# Patient Record
Sex: Male | Born: 1965 | Race: White | Hispanic: No | Marital: Married | Smoking: Never smoker
Health system: Southern US, Community
[De-identification: ages and names within clinical notes are randomized; demographics above are authoritative.]

## PROBLEM LIST (undated history)

## (undated) DIAGNOSIS — N189 Chronic kidney disease, unspecified: Secondary | ICD-10-CM

## (undated) HISTORY — PX: EYE SURGERY: SHX253

---

## 1998-11-15 ENCOUNTER — Encounter: Payer: Self-pay | Admitting: Emergency Medicine

## 1998-11-15 ENCOUNTER — Emergency Department (HOSPITAL_COMMUNITY): Admission: EM | Admit: 1998-11-15 | Discharge: 1998-11-15 | Payer: Self-pay | Admitting: Emergency Medicine

## 2003-02-25 ENCOUNTER — Encounter: Payer: Self-pay | Admitting: Emergency Medicine

## 2003-02-25 ENCOUNTER — Emergency Department (HOSPITAL_COMMUNITY): Admission: AD | Admit: 2003-02-25 | Discharge: 2003-02-25 | Payer: Self-pay | Admitting: Emergency Medicine

## 2003-03-17 ENCOUNTER — Ambulatory Visit (HOSPITAL_COMMUNITY): Admission: RE | Admit: 2003-03-17 | Discharge: 2003-03-17 | Payer: Self-pay | Admitting: Urology

## 2003-03-17 ENCOUNTER — Encounter: Payer: Self-pay | Admitting: Urology

## 2004-02-23 ENCOUNTER — Emergency Department (HOSPITAL_COMMUNITY): Admission: EM | Admit: 2004-02-23 | Discharge: 2004-02-23 | Payer: Self-pay | Admitting: Emergency Medicine

## 2007-05-26 ENCOUNTER — Emergency Department (HOSPITAL_COMMUNITY): Admission: EM | Admit: 2007-05-26 | Discharge: 2007-05-26 | Payer: Self-pay | Admitting: Emergency Medicine

## 2008-02-13 ENCOUNTER — Emergency Department (HOSPITAL_COMMUNITY): Admission: EM | Admit: 2008-02-13 | Discharge: 2008-02-13 | Payer: Self-pay | Admitting: Family Medicine

## 2014-07-28 ENCOUNTER — Emergency Department (HOSPITAL_COMMUNITY): Payer: 59

## 2014-07-28 ENCOUNTER — Emergency Department (HOSPITAL_COMMUNITY)
Admission: EM | Admit: 2014-07-28 | Discharge: 2014-07-28 | Disposition: A | Discharge status: Home / Self Care | Payer: 59 | Attending: Emergency Medicine | Admitting: Emergency Medicine

## 2014-07-28 ENCOUNTER — Encounter (HOSPITAL_COMMUNITY): Payer: Self-pay | Admitting: Emergency Medicine

## 2014-07-28 DIAGNOSIS — R109 Unspecified abdominal pain: Secondary | ICD-10-CM | POA: Diagnosis present

## 2014-07-28 DIAGNOSIS — Z79899 Other long term (current) drug therapy: Secondary | ICD-10-CM | POA: Insufficient documentation

## 2014-07-28 DIAGNOSIS — N201 Calculus of ureter: Secondary | ICD-10-CM | POA: Diagnosis not present

## 2014-07-28 LAB — I-STAT CHEM 8, ED
BUN: 17 mg/dL (ref 6–23)
CHLORIDE: 102 mmol/L (ref 96–112)
Calcium, Ion: 1.21 mmol/L (ref 1.12–1.23)
Creatinine, Ser: 1.2 mg/dL (ref 0.50–1.35)
GLUCOSE: 111 mg/dL — AB (ref 70–99)
HCT: 45 % (ref 39.0–52.0)
HEMOGLOBIN: 15.3 g/dL (ref 13.0–17.0)
POTASSIUM: 4.2 mmol/L (ref 3.5–5.1)
Sodium: 141 mmol/L (ref 135–145)
TCO2: 23 mmol/L (ref 0–100)

## 2014-07-28 LAB — URINE MICROSCOPIC-ADD ON

## 2014-07-28 LAB — URINALYSIS, ROUTINE W REFLEX MICROSCOPIC
Bilirubin Urine: NEGATIVE
Glucose, UA: NEGATIVE mg/dL
Ketones, ur: NEGATIVE mg/dL
Leukocytes, UA: NEGATIVE
Nitrite: NEGATIVE
Protein, ur: 30 mg/dL — AB
Specific Gravity, Urine: 1.024 (ref 1.005–1.030)
Urobilinogen, UA: 1 mg/dL (ref 0.0–1.0)
pH: 7 (ref 5.0–8.0)

## 2014-07-28 MED ORDER — ONDANSETRON HCL 4 MG PO TABS: 4.0000 mg | ORAL_TABLET | Freq: Four times a day (QID) | ORAL | Status: DC

## 2014-07-28 MED ORDER — OXYCODONE-ACETAMINOPHEN 5-325 MG PO TABS: 1.0000 | ORAL_TABLET | Freq: Four times a day (QID) | ORAL | Status: DC | PRN

## 2014-07-28 MED ORDER — IBUPROFEN 600 MG PO TABS: 600.0000 mg | ORAL_TABLET | Freq: Four times a day (QID) | ORAL | Status: DC | PRN

## 2014-07-28 MED ORDER — HYDROMORPHONE HCL 1 MG/ML IJ SOLN
1.0000 mg | Freq: Once | INTRAMUSCULAR | Status: AC
  Administered 2014-07-28: 1 mg via INTRAVENOUS
  Filled 2014-07-28: qty 1

## 2014-07-28 NOTE — ED Notes (Signed)
Patient stated his stomach is upset. RN Rennis Chris. Notified

## 2014-07-28 NOTE — ED Notes (Signed)
Patient transported to CT 

## 2014-07-28 NOTE — ED Provider Notes (Signed)
CSN: [id number]     Arrival date & time 07/28/14  1250 History   First MD Initiated Contact with Patient 07/28/14 1422     Chief Complaint  Patient presents with  . Flank Pain     (Consider location/radiation/quality/duration/timing/severity/associated sxs/prior Treatment) HPI Comments: Pt is a 49 y.o. male with 1 day history of left flank pain that wrapped around to abdomen. PMH significant for renal stone requiring lithotripsy 10 years ago. Pt says he went to the gym earlier today and after finishing his workout had sudden onset left lower back pain that wrapped around to the front of his abdomen while driving. Describes pain as crampy, comes/goes briefly. He was unable to find any position that made the pain better. Pain was made worse with bending over in the ED when changing into gown, after which he got some dilaudid which has completely taken the pain away. Last void was earlier this morning, says he is unable to give sample currently. Last BM was this morning, normal, no blood or dark stool. He feels like the pain was similar to the kidney stone he had in the past.  Patient is a 49 y.o. male presenting with flank pain. The history is provided by the patient. No language interpreter was used.  Flank Pain This is a new problem. The current episode started today. The problem occurs constantly. The problem has been resolved (currently gone after receiving dilaudid). Associated symptoms include abdominal pain, a fever (felt hot flash, no measured fever) and nausea. Pertinent negatives include no anorexia, change in bowel habit, chest pain, coughing, headaches, myalgias, rash or vomiting. The symptoms are aggravated by bending and twisting. Treatments tried: IV dilaudid in ED. The treatment provided significant relief.    History reviewed. No pertinent past medical history. History reviewed. No pertinent past surgical history. No family history on file. History  Substance Use Topics  . Smoking  status: Never Smoker   . Smokeless tobacco: Not on file  . Alcohol Use: Yes    Review of Systems  Constitutional: Positive for fever (felt hot flash, no measured fever). Negative for appetite change.  HENT: Negative.   Eyes: Negative.   Respiratory: Negative for cough and shortness of breath.   Cardiovascular: Negative for chest pain and leg swelling.  Gastrointestinal: Positive for nausea and abdominal pain. Negative for vomiting, diarrhea, constipation, blood in stool, anorexia and change in bowel habit.  Genitourinary: Positive for flank pain. Negative for dysuria and urgency.  Musculoskeletal: Positive for back pain. Negative for myalgias.  Skin: Negative for rash.  Neurological: Negative for dizziness and headaches.  All other systems reviewed and are negative.     Allergies  Review of patient's allergies indicates no known allergies.  Home Medications   Prior to Admission medications   Medication Sig Start Date End Date Taking? Authorizing Provider  KRILL OIL PO Take by mouth.   Yes Historical Provider, MD  Multiple Vitamin (MULTIVITAMIN WITH MINERALS) TABS tablet Take 1 tablet by mouth daily.   Yes Historical Provider, MD   BP 124/74 mmHg  Pulse 56  Temp(Src) 97.6 F (36.4 C) (Oral)  Resp 18  SpO2 97% Physical Exam  Constitutional: He is oriented to person, place, and time. He appears well-developed and well-nourished.  HENT:  Head: Normocephalic and atraumatic.  Eyes: Conjunctivae and EOM are normal. Pupils are equal, round, and reactive to light.  Cardiovascular: Normal rate, regular rhythm, normal heart sounds and intact distal pulses.  Exam reveals no gallop and no  friction rub.   No murmur heard. Pulmonary/Chest: Effort normal and breath sounds normal. No respiratory distress. He has no wheezes.  Abdominal: Bowel sounds are normal. He exhibits no distension. There is no tenderness. There is no rebound and no guarding.  Somewhat firm abdomen, but completely  denies tenderness to deep palpation  Musculoskeletal: He exhibits no edema or tenderness.  Neurological: He is alert and oriented to person, place, and time.  Skin: Skin is warm and dry.  Psychiatric: He has a normal mood and affect. His behavior is normal. Judgment and thought content normal.  Nursing note and vitals reviewed.   ED Course  Procedures (including critical care time) Labs Review Labs Reviewed  URINALYSIS, ROUTINE W REFLEX MICROSCOPIC    Imaging Review No results found.   EKG Interpretation None      MDM   Final diagnoses:  None   UA pending. CT renal stone study ordered. Pt reports complete resolution of the pain with 1mg  dilaudid x 1.  Care transferred over at 3:56 PM   Leone Brand, MD 07/28/14 [place], MD 07/29/14 [phone]

## 2014-07-28 NOTE — ED Notes (Signed)
Bed: EL38 Expected date:  Expected time:  Means of arrival:  Comments: Pt still in room

## 2014-07-28 NOTE — Discharge Instructions (Signed)
Pain control:  Start taking ibuprofen 600mg  every 6 hours for pain.  For breakthrough pain, take 1-2 tablets of percocet every 6 hours for severe pain  Continue to drink plenty of water. If you do not pass the stone call your urologist.  Kidney Stones Kidney stones (urolithiasis) are deposits that form inside your kidneys. The intense pain is caused by the stone moving through the urinary tract. When the stone moves, the ureter goes into spasm around the stone. The stone is usually passed in the urine.  CAUSES   A disorder that makes certain neck glands produce too much parathyroid hormone (primary hyperparathyroidism).  A buildup of uric acid crystals, similar to gout in your joints.  Narrowing (stricture) of the ureter.  A kidney obstruction present at birth (congenital obstruction).  Previous surgery on the kidney or ureters.  Numerous kidney infections. SYMPTOMS   Feeling sick to your stomach (nauseous).  Throwing up (vomiting).  Blood in the urine (hematuria).  Pain that usually spreads (radiates) to the groin.  Frequency or urgency of urination. DIAGNOSIS   Taking a history and physical exam.  Blood or urine tests.  CT scan.  Occasionally, an examination of the inside of the urinary bladder (cystoscopy) is performed. TREATMENT   Observation.  Increasing your fluid intake.  Extracorporeal shock wave lithotripsy--This is a noninvasive procedure that uses shock waves to break up kidney stones.  Surgery may be needed if you have severe pain or persistent obstruction. There are various surgical procedures. Most of the procedures are performed with the use of small instruments. Only small incisions are needed to accommodate these instruments, so recovery time is minimized. The size, location, and chemical composition are all important variables that will determine the proper choice of action for you. Talk to your health care provider to better understand your  situation so that you will minimize the risk of injury to yourself and your kidney.  HOME CARE INSTRUCTIONS   Drink enough water and fluids to keep your urine clear or pale yellow. This will help you to pass the stone or stone fragments.  Strain all urine through the provided strainer. Keep all particulate matter and stones for your health care provider to see. The stone causing the pain may be as small as a grain of salt. It is very important to use the strainer each and every time you pass your urine. The collection of your stone will allow your health care provider to analyze it and verify that a stone has actually passed. The stone analysis will often identify what you can do to reduce the incidence of recurrences.  Only take over-the-counter or prescription medicines for pain, discomfort, or fever as directed by your health care provider.  Make a follow-up appointment with your health care provider as directed.  Get follow-up X-rays if required. The absence of pain does not always mean that the stone has passed. It may have only stopped moving. If the urine remains completely obstructed, it can cause loss of kidney function or even complete destruction of the kidney. It is your responsibility to make sure X-rays and follow-ups are completed. Ultrasounds of the kidney can show blockages and the status of the kidney. Ultrasounds are not associated with any radiation and can be performed easily in a matter of minutes. SEEK MEDICAL CARE IF:  You experience pain that is progressive and unresponsive to any pain medicine you have been prescribed. SEEK IMMEDIATE MEDICAL CARE IF:   Pain cannot be controlled with  the prescribed medicine.  You have a fever or shaking chills.  The severity or intensity of pain increases over 18 hours and is not relieved by pain medicine.  You develop a new onset of abdominal pain.  You feel faint or pass out.  You are unable to urinate. MAKE SURE YOU:    Understand these instructions.  Will watch your condition.  Will get help right away if you are not doing well or get worse. Document Released: 06/06/2005 Document Revised: 02/06/2013 Document Reviewed: 11/07/2012 [facility] Patient Information 2015 [place], [place]. This information is not intended to replace advice given to you by your health care provider. Make sure you discuss any questions you have with your health care provider.

## 2014-07-28 NOTE — ED Provider Notes (Signed)
Care assumed from Dr. Audie Pinto.  CT Stone study shows 12x81mm proximal ureteral stone.  Pain is 2/10 currently.  Will discuss with Urology.  Pain still controlled.  I spoke with Dr. Junious Silk who will help establish close outpatient follow up.  Pt was given return precautions including worsening pain, vomiting, fevers, etc.    Clinical Impression: 1. Ureteral stone   2. Left flank pain       Houston Siren III, MD 07/28/14 [phone]

## 2014-07-28 NOTE — ED Notes (Signed)
Patient states he is unable to give UA sample.

## 2014-07-28 NOTE — ED Notes (Signed)
Per EMS, Pt from home, c/o L flank pain to L groin pain starting around 10 am this morning. Pt has hx of kidney stones. A&Ox4. Pt had worked out prior to feeling the pain. Pain has gotten progressively worse since. Pt compares the pain to previous kidney stone pain. Pt pale and diaphoretic, restless. Pt received 250 mcg of fentanyl. Pain 18/20.

## 2014-07-28 NOTE — ED Notes (Signed)
Bedside report received from previous RN, Lanelle Bal.

## 2014-07-28 NOTE — ED Notes (Signed)
Pt reports while attempting changing into gown onset of nausea and sharp/throbbing pain to left flank pain.

## 2014-07-28 NOTE — ED Notes (Signed)
Bed: [facility]ALD Expected date:  Expected time:  Means of arrival:  Comments: ems 

## 2014-07-28 NOTE — ED Notes (Signed)
Ginger ale given

## 2014-07-28 NOTE — Progress Notes (Signed)
  CARE MANAGEMENT ED NOTE 07/28/2014  Patient:  JAYON, MATTON A   Account Number:  [id number]  Date Initiated:  07/28/2014  Documentation initiated by:  Livia Snellen  Subjective/Objective Assessment:   Patient presents to Ed with flank pain     Subjective/Objective Assessment Detail:   Pmhx of kidney stone     Action/Plan:   Action/Plan Detail:   Anticipated DC Date:       Status Recommendation to Physician:   Result of Recommendation:    Other ED [place]  Other  PCP issues    Choice offered to / List presented to:            Status of service:  Completed, signed off  ED Comments:   ED Comments Detail:  EDCM spoek to patient and his wife at bedside.  Patient reports he oes nto have a pcp because, "I never get sick." Patient reports he has been seen by Dr. Christella Noa of [facility] in [place] who is his wife's pcp as well. EDCM Discussed importance and purpose of having a pcp. Patient verbalized understanding and plans on making Dr. Doyle Askew his pcp.  N further EDCM needs at this time.

## 2014-07-31 ENCOUNTER — Other Ambulatory Visit: Payer: Self-pay | Admitting: Urology

## 2014-08-04 ENCOUNTER — Encounter (HOSPITAL_COMMUNITY): Payer: Self-pay | Admitting: General Practice

## 2014-08-11 ENCOUNTER — Ambulatory Visit (HOSPITAL_COMMUNITY): Payer: 59

## 2014-08-11 ENCOUNTER — Encounter (HOSPITAL_COMMUNITY): Payer: Self-pay | Admitting: General Practice

## 2014-08-11 ENCOUNTER — Ambulatory Visit (HOSPITAL_COMMUNITY)
Admission: RE | Admit: 2014-08-11 | Discharge: 2014-08-11 | Disposition: A | Discharge status: Home / Self Care | Payer: 59 | Source: Ambulatory Visit | Attending: Urology | Admitting: Urology

## 2014-08-11 ENCOUNTER — Encounter (HOSPITAL_COMMUNITY)
Admission: RE | Disposition: A | Discharge status: Home / Self Care | Payer: Self-pay | Source: Ambulatory Visit | Attending: Urology

## 2014-08-11 DIAGNOSIS — N201 Calculus of ureter: Secondary | ICD-10-CM | POA: Insufficient documentation

## 2014-08-11 HISTORY — DX: Chronic kidney disease, unspecified: N18.9

## 2014-08-11 SURGERY — LITHOTRIPSY, ESWL
Anesthesia: LOCAL | Laterality: Left

## 2014-08-11 MED ORDER — DIPHENHYDRAMINE HCL 25 MG PO CAPS
25.0000 mg | ORAL_CAPSULE | ORAL | Status: AC
  Administered 2014-08-11: 25 mg via ORAL
  Filled 2014-08-11: qty 1

## 2014-08-11 MED ORDER — CIPROFLOXACIN HCL 500 MG PO TABS
500.0000 mg | ORAL_TABLET | ORAL | Status: AC
  Administered 2014-08-11: 500 mg via ORAL
  Filled 2014-08-11: qty 1

## 2014-08-11 MED ORDER — DEXTROSE-NACL 5-0.45 % IV SOLN
INTRAVENOUS | Status: DC
  Administered 2014-08-11: 16:00:00 via INTRAVENOUS

## 2014-08-11 MED ORDER — DIAZEPAM 5 MG PO TABS
10.0000 mg | ORAL_TABLET | ORAL | Status: AC
  Administered 2014-08-11: 10 mg via ORAL
  Filled 2014-08-11: qty 2

## 2014-08-11 NOTE — H&P (Signed)
Active Problems Problems  1. Calculus of left ureter (N20.1)  History of Present Illness Mike Pena is a 49 yo WM former patient with a history of stones who was in the ER Monday for severe left flank pain with nausea and vomiting. He was found on CT to have a 12 x 55m left proximal stone. He had no hematuria. He had no voiding complaints. He had ESWL for a right sided stone about 12 years ago.   Surgical History Problems  1. History of Lithotripsy  Current Meds 1. Fish Oil CAPS;  Therapy: (Recorded:78Feb2016) to Recorded 2. Oxycodone-Acetaminophen 5-325 MG Oral Tablet;  Therapy: (Recorded:78Feb2016) to Recorded  Allergies Medication  1. No Known Drug Allergies  Family History Problems  1. No pertinent family history : Mother  Social History Problems    Married   Never a smoker   No alcohol use   No caffeine use   Number of children   1 girl   Occupation   PEngineer, structural Review of Systems Genitourinary, constitutional, skin, eye, otolaryngeal, hematologic/lymphatic, cardiovascular, pulmonary, endocrine, musculoskeletal, gastrointestinal, neurological and psychiatric system(s) were reviewed and pertinent findings if present are noted and are otherwise negative.  Gastrointestinal: nausea and flank pain.    Vitals Vital Signs [Data Includes: Last 1 Day]  Recorded: 123VKP224408:25AM  Height: 5 ft 11 in Weight: 205 lb  BMI Calculated: 28.59 BSA Calculated: 2.13 Blood Pressure: 120 / 80 Temperature: 97.1 F Heart Rate: 63  Physical Exam Constitutional: Well nourished and well developed . No acute distress.  ENT:. The ears and nose are normal in appearance.  Neck: The appearance of the neck is normal and no neck mass is present.  Pulmonary: No respiratory distress and normal respiratory rhythm and effort.  Cardiovascular: Heart rate and rhythm are normal . No peripheral edema.  Abdomen: The abdomen is soft and nontender. No masses are palpated. No CVA  tenderness. No hernias are palpable. No hepatosplenomegaly noted.  Skin: Normal skin turgor, no visible rash and no visible skin lesions.  Neuro/Psych:. Mood and affect are appropriate.    Results/Data Urine [Data Includes: Last 1 Day]   [id number] COLOR YELLOW   APPEARANCE CLEAR   SPECIFIC GRAVITY 1.030   pH 5.5   GLUCOSE NEG mg/dL  BILIRUBIN NEG   KETONE NEG mg/dL  BLOOD SMALL   PROTEIN NEG mg/dL  UROBILINOGEN 0.2 mg/dL  NITRITE NEG   LEUKOCYTE ESTERASE NEG   SQUAMOUS EPITHELIAL/HPF RARE   WBC 0-2 WBC/hpf  RBC 3-6 RBC/hpf  BACTERIA RARE   CRYSTALS NONE SEEN   CASTS NONE SEEN   Other MUCUS NOTED    Old records or history reviewed: ER notes reviewed.  The following images/tracing/specimen were independently visualized:  CT films and report reviewed. KUB today show a 12 x 881mstone just above the left L3 transverse process. There are no other bone, gas or soft tissue abnormalities.  The following clinical lab reports were reviewed:  UA and ER labs reviewed.    Assessment Assessed  1. Calculus of left ureter (N20.1)  He has a 12 x 87m5mtone in the left proximal ureter that has not progressed since Monday.   Plan Calculus of left ureter  1. Start: Tamsulosin HCl - 0.4 MG Oral Capsule; TAKE 1 CAPSULE Daily 2. Follow-up Schedule Surgery Office  Follow-up  Status: Hold For - Appointment   Requested for: [id number] KUB; Status:Resulted - Requires Verification;   Done: 1: 70LID0301:00AM Health Maintenance  4. UA With REFLEX; [  Do Not Release]; Status:Resulted - Requires Verification;   Done:  55OPR6742 08:19AM  I reviewed the options of MET, ESWL and ureteroscopy and I think ESWL is the most appropriate active treatment at this time.  I reviewed the risks of bleeding, infection, renal and adjacent organ injury, thrombotic events and anesthetic risks.   I am going to add tamsulosin.  He may want to wait until 2/22 for the procedure if his pain remains controlled.

## 2014-08-11 NOTE — Discharge Instructions (Signed)

## 2015-03-23 ENCOUNTER — Other Ambulatory Visit: Payer: Self-pay | Admitting: Urology

## 2015-04-07 ENCOUNTER — Encounter (HOSPITAL_COMMUNITY): Payer: Self-pay

## 2015-04-15 NOTE — H&P (Signed)
  Active Problems Problems  1. Kidney stone on left side (N20.0)  History of Present Illness Mike Pena returns today in f/u for his history of stones. He had ESWL on 08/11/14 for a left UPJ stone. He was found to have 2 4-73mm LLP fragments on f/u KUB in 4/16 and those remain unchanged on KUB today. He has 10-20 RBC's on UA. He has had no flank pain, hematuria or voiding complaints.   Past Medical History Problems  1. History of renal calculi (H06.237)  Surgical History Problems  1. History of Lithotripsy 2. History of Lithotripsy  Current Meds 1. Fish Oil CAPS;  Therapy: (Recorded:10Feb2016) to Recorded  Allergies Medication  1. No Known Drug Allergies  Family History Problems  1. No pertinent family history : Mother  Social History Problems  1. Married 2. Never a smoker 3. No alcohol use 4. No caffeine use 5. Number of children   1 girl 6. Occupation   Engineer, structural  Review of Systems Genitourinary, constitutional, skin, eye, otolaryngeal, hematologic/lymphatic, cardiovascular, pulmonary, endocrine, musculoskeletal, gastrointestinal, neurological and psychiatric system(s) were reviewed and pertinent findings if present are noted and are otherwise negative.    Vitals Vital Signs [Data Includes: Last 1 Day]  Recorded: 30Sep2016 01:18PM  Blood Pressure: 124 / 80 Temperature: 97.3 F Heart Rate: 67  Physical Exam Constitutional: Well nourished and well developed . No acute distress.  Pulmonary: No respiratory distress and normal respiratory rhythm and effort.  Cardiovascular: Heart rate and rhythm are normal . No peripheral edema.    Results/Data Urine [Data Includes: Last 1 Day]   30Sep2016  COLOR YELLOW   APPEARANCE CLEAR   SPECIFIC GRAVITY 1.025   pH 5.5   GLUCOSE NEGATIVE   BILIRUBIN NEGATIVE   KETONE NEGATIVE   BLOOD 3+   PROTEIN NEGATIVE   NITRITE NEGATIVE   LEUKOCYTE ESTERASE NEGATIVE   SQUAMOUS EPITHELIAL/HPF 0-5 HPF  WBC 0-5 WBC/HPF  RBC 10-20  RBC/HPF  BACTERIA NONE SEEN HPF  CRYSTALS NONE SEEN HPF  CASTS NONE SEEN LPF  Yeast NONE SEEN HPF   The following images/tracing/specimen were independently visualized:  KUB today shows 2 4-109mm LLP fragments that have not changed since April. There are no other bone, gas or soft tissue abnormalities.  The following clinical lab reports were reviewed:  UA reviewed.    Assessment Assessed  1. Kidney stone on left side (N20.0)  [name]'s stones have not changed and he needs to be considered for repeat treatment.   Plan Health Maintenance  1. UA With REFLEX; [Do Not Release]; Status:Resulted - Requires Verification;   Done:  [id number] 01:02PM Kidney stone on left side  2. Follow-up Schedule Surgery Office  Follow-up  Status: Complete  Done: [id number]  I discussed MET, ESWL and ureteroscopy and we will get him set up for ESWL.  I reviewed the risks again with him in detail and we will schedule him electively.   Signatures Electronically signed by : Irine Seal, M.D.; Mar 20 2015  2:06PM EST

## 2015-04-16 ENCOUNTER — Ambulatory Visit (HOSPITAL_COMMUNITY): Payer: 59

## 2015-04-16 ENCOUNTER — Encounter (HOSPITAL_COMMUNITY): Payer: Self-pay | Admitting: *Deleted

## 2015-04-16 ENCOUNTER — Encounter (HOSPITAL_COMMUNITY)
Admission: RE | Disposition: A | Discharge status: Home / Self Care | Payer: Self-pay | Source: Ambulatory Visit | Attending: Urology

## 2015-04-16 ENCOUNTER — Ambulatory Visit (HOSPITAL_COMMUNITY)
Admission: RE | Admit: 2015-04-16 | Discharge: 2015-04-16 | Disposition: A | Discharge status: Home / Self Care | Payer: 59 | Source: Ambulatory Visit | Attending: Urology | Admitting: Urology

## 2015-04-16 DIAGNOSIS — N200 Calculus of kidney: Secondary | ICD-10-CM | POA: Diagnosis present

## 2015-04-16 DIAGNOSIS — Z87442 Personal history of urinary calculi: Secondary | ICD-10-CM | POA: Insufficient documentation

## 2015-04-16 SURGERY — LITHOTRIPSY, ESWL
Anesthesia: LOCAL | Laterality: Left

## 2015-04-16 MED ORDER — DIAZEPAM 5 MG PO TABS
10.0000 mg | ORAL_TABLET | ORAL | Status: AC
  Administered 2015-04-16: 10 mg via ORAL
  Filled 2015-04-16: qty 2

## 2015-04-16 MED ORDER — DIPHENHYDRAMINE HCL 25 MG PO CAPS
25.0000 mg | ORAL_CAPSULE | ORAL | Status: AC
  Administered 2015-04-16: 25 mg via ORAL
  Filled 2015-04-16: qty 1

## 2015-04-16 MED ORDER — CIPROFLOXACIN HCL 500 MG PO TABS
500.0000 mg | ORAL_TABLET | ORAL | Status: AC
  Administered 2015-04-16: 500 mg via ORAL
  Filled 2015-04-16: qty 1

## 2015-04-16 MED ORDER — SODIUM CHLORIDE 0.9 % IV SOLN
INTRAVENOUS | Status: DC
  Administered 2015-04-16: 10:00:00 via INTRAVENOUS

## 2015-04-16 NOTE — Interval H&P Note (Signed)
History and Physical Interval Note:  04/16/2015 11:46 AM  Mike Pena  has presented today for surgery, with the diagnosis of LEFT URETERAL STONES  The various methods of treatment have been discussed with the patient and family. After consideration of risks, benefits and other options for treatment, the patient has consented to  Procedure(s): LEFT EXTRACORPOREAL SHOCK WAVE LITHOTRIPSY (ESWL) (Left) as a surgical intervention .  The patient's history has been reviewed, patient examined, no change in status, stable for surgery.  I have reviewed the patient's chart and labs.  Questions were answered to the patient's satisfaction.     [name],LES

## 2015-04-16 NOTE — Discharge Instructions (Signed)
1. You should strain your urine and collect all fragments and bring them to your follow up appointment.  °2. You should take your pain medication as needed.  Please call if your pain is severe to the point that it is not controlled with your pain medication. °3. You should call if you develop fever > 101 or persistent nausea or vomiting. °4. Your doctor may prescribe tamsulosin to take to help facilitate stone passage. °

## 2015-04-16 NOTE — Op Note (Signed)
Please see scanned chart for ESWL operative note. 

## 2016-10-10 DIAGNOSIS — N200 Calculus of kidney: Secondary | ICD-10-CM | POA: Diagnosis not present

## 2017-06-14 DIAGNOSIS — Z131 Encounter for screening for diabetes mellitus: Secondary | ICD-10-CM | POA: Diagnosis not present

## 2017-06-14 DIAGNOSIS — Z0000 Encounter for general adult medical examination without abnormal findings: Secondary | ICD-10-CM | POA: Diagnosis not present

## 2017-06-14 DIAGNOSIS — Z136 Encounter for screening for cardiovascular disorders: Secondary | ICD-10-CM | POA: Diagnosis not present

## 2017-07-31 DIAGNOSIS — M5386 Other specified dorsopathies, lumbar region: Secondary | ICD-10-CM | POA: Diagnosis not present

## 2017-07-31 DIAGNOSIS — M9902 Segmental and somatic dysfunction of thoracic region: Secondary | ICD-10-CM | POA: Diagnosis not present

## 2017-07-31 DIAGNOSIS — M9908 Segmental and somatic dysfunction of rib cage: Secondary | ICD-10-CM | POA: Diagnosis not present

## 2017-08-01 DIAGNOSIS — M5386 Other specified dorsopathies, lumbar region: Secondary | ICD-10-CM | POA: Diagnosis not present

## 2017-08-01 DIAGNOSIS — M9908 Segmental and somatic dysfunction of rib cage: Secondary | ICD-10-CM | POA: Diagnosis not present

## 2017-08-01 DIAGNOSIS — M9902 Segmental and somatic dysfunction of thoracic region: Secondary | ICD-10-CM | POA: Diagnosis not present

## 2017-08-03 DIAGNOSIS — M9902 Segmental and somatic dysfunction of thoracic region: Secondary | ICD-10-CM | POA: Diagnosis not present

## 2017-08-03 DIAGNOSIS — M9908 Segmental and somatic dysfunction of rib cage: Secondary | ICD-10-CM | POA: Diagnosis not present

## 2017-08-03 DIAGNOSIS — M5386 Other specified dorsopathies, lumbar region: Secondary | ICD-10-CM | POA: Diagnosis not present

## 2017-08-08 DIAGNOSIS — M9902 Segmental and somatic dysfunction of thoracic region: Secondary | ICD-10-CM | POA: Diagnosis not present

## 2017-08-08 DIAGNOSIS — M5386 Other specified dorsopathies, lumbar region: Secondary | ICD-10-CM | POA: Diagnosis not present

## 2017-08-08 DIAGNOSIS — M9908 Segmental and somatic dysfunction of rib cage: Secondary | ICD-10-CM | POA: Diagnosis not present

## 2017-08-10 DIAGNOSIS — M9902 Segmental and somatic dysfunction of thoracic region: Secondary | ICD-10-CM | POA: Diagnosis not present

## 2017-08-10 DIAGNOSIS — M9908 Segmental and somatic dysfunction of rib cage: Secondary | ICD-10-CM | POA: Diagnosis not present

## 2017-08-10 DIAGNOSIS — M5386 Other specified dorsopathies, lumbar region: Secondary | ICD-10-CM | POA: Diagnosis not present

## 2017-08-16 DIAGNOSIS — M9908 Segmental and somatic dysfunction of rib cage: Secondary | ICD-10-CM | POA: Diagnosis not present

## 2017-08-16 DIAGNOSIS — M9902 Segmental and somatic dysfunction of thoracic region: Secondary | ICD-10-CM | POA: Diagnosis not present

## 2017-08-16 DIAGNOSIS — M5386 Other specified dorsopathies, lumbar region: Secondary | ICD-10-CM | POA: Diagnosis not present

## 2017-08-17 DIAGNOSIS — M9908 Segmental and somatic dysfunction of rib cage: Secondary | ICD-10-CM | POA: Diagnosis not present

## 2017-08-17 DIAGNOSIS — M5386 Other specified dorsopathies, lumbar region: Secondary | ICD-10-CM | POA: Diagnosis not present

## 2017-08-17 DIAGNOSIS — M9902 Segmental and somatic dysfunction of thoracic region: Secondary | ICD-10-CM | POA: Diagnosis not present

## 2018-01-23 DIAGNOSIS — Z1211 Encounter for screening for malignant neoplasm of colon: Secondary | ICD-10-CM | POA: Diagnosis not present

## 2018-02-07 DIAGNOSIS — D485 Neoplasm of uncertain behavior of skin: Secondary | ICD-10-CM | POA: Diagnosis not present

## 2018-02-07 DIAGNOSIS — D2272 Melanocytic nevi of left lower limb, including hip: Secondary | ICD-10-CM | POA: Diagnosis not present

## 2018-02-07 DIAGNOSIS — D3610 Benign neoplasm of peripheral nerves and autonomic nervous system, unspecified: Secondary | ICD-10-CM | POA: Diagnosis not present

## 2018-02-07 DIAGNOSIS — L814 Other melanin hyperpigmentation: Secondary | ICD-10-CM | POA: Diagnosis not present

## 2018-02-26 ENCOUNTER — Emergency Department (HOSPITAL_COMMUNITY)
Admission: EM | Admit: 2018-02-26 | Discharge: 2018-02-26 | Disposition: A | Discharge status: Home / Self Care | Payer: 59 | Source: Home / Self Care | Attending: Emergency Medicine | Admitting: Emergency Medicine

## 2018-02-26 ENCOUNTER — Encounter (HOSPITAL_COMMUNITY): Payer: Self-pay | Admitting: Internal Medicine

## 2018-02-26 ENCOUNTER — Encounter (HOSPITAL_COMMUNITY): Payer: Self-pay | Admitting: Emergency Medicine

## 2018-02-26 ENCOUNTER — Emergency Department (HOSPITAL_COMMUNITY): Payer: 59

## 2018-02-26 ENCOUNTER — Other Ambulatory Visit: Payer: Self-pay

## 2018-02-26 ENCOUNTER — Observation Stay (HOSPITAL_COMMUNITY)
Admission: EM | Admit: 2018-02-26 | Discharge: 2018-02-28 | Disposition: A | Discharge status: Home / Self Care | Payer: 59 | Attending: Internal Medicine | Admitting: Internal Medicine

## 2018-02-26 DIAGNOSIS — N202 Calculus of kidney with calculus of ureter: Principal | ICD-10-CM | POA: Insufficient documentation

## 2018-02-26 DIAGNOSIS — Z79899 Other long term (current) drug therapy: Secondary | ICD-10-CM

## 2018-02-26 DIAGNOSIS — R112 Nausea with vomiting, unspecified: Secondary | ICD-10-CM | POA: Diagnosis not present

## 2018-02-26 DIAGNOSIS — N201 Calculus of ureter: Secondary | ICD-10-CM

## 2018-02-26 DIAGNOSIS — D72828 Other elevated white blood cell count: Secondary | ICD-10-CM | POA: Diagnosis not present

## 2018-02-26 DIAGNOSIS — N138 Other obstructive and reflux uropathy: Secondary | ICD-10-CM | POA: Diagnosis not present

## 2018-02-26 DIAGNOSIS — N400 Enlarged prostate without lower urinary tract symptoms: Secondary | ICD-10-CM | POA: Diagnosis not present

## 2018-02-26 DIAGNOSIS — N401 Enlarged prostate with lower urinary tract symptoms: Secondary | ICD-10-CM | POA: Diagnosis not present

## 2018-02-26 DIAGNOSIS — N211 Calculus in urethra: Secondary | ICD-10-CM | POA: Diagnosis not present

## 2018-02-26 DIAGNOSIS — N200 Calculus of kidney: Secondary | ICD-10-CM

## 2018-02-26 DIAGNOSIS — I959 Hypotension, unspecified: Secondary | ICD-10-CM | POA: Diagnosis not present

## 2018-02-26 DIAGNOSIS — N179 Acute kidney failure, unspecified: Secondary | ICD-10-CM | POA: Insufficient documentation

## 2018-02-26 DIAGNOSIS — R109 Unspecified abdominal pain: Secondary | ICD-10-CM | POA: Diagnosis not present

## 2018-02-26 DIAGNOSIS — R1084 Generalized abdominal pain: Secondary | ICD-10-CM | POA: Diagnosis not present

## 2018-02-26 DIAGNOSIS — R0902 Hypoxemia: Secondary | ICD-10-CM | POA: Diagnosis not present

## 2018-02-26 DIAGNOSIS — N132 Hydronephrosis with renal and ureteral calculous obstruction: Secondary | ICD-10-CM | POA: Diagnosis not present

## 2018-02-26 DIAGNOSIS — D72829 Elevated white blood cell count, unspecified: Secondary | ICD-10-CM | POA: Diagnosis not present

## 2018-02-26 LAB — COMPREHENSIVE METABOLIC PANEL
ALT: 27 U/L (ref 0–44)
AST: 30 U/L (ref 15–41)
Albumin: 4.2 g/dL (ref 3.5–5.0)
Alkaline Phosphatase: 50 U/L (ref 38–126)
Anion gap: 12 (ref 5–15)
BUN: 18 mg/dL (ref 6–20)
CO2: 24 mmol/L (ref 22–32)
Calcium: 9.1 mg/dL (ref 8.9–10.3)
Chloride: 106 mmol/L (ref 98–111)
Creatinine, Ser: 1.66 mg/dL — ABNORMAL HIGH (ref 0.61–1.24)
GFR calc Af Amer: 54 mL/min — ABNORMAL LOW (ref >60)
GFR calc non Af Amer: 46 mL/min — ABNORMAL LOW (ref >60)
Glucose, Bld: 146 mg/dL — ABNORMAL HIGH (ref 70–99)
Potassium: 4.7 mmol/L (ref 3.5–5.1)
Sodium: 142 mmol/L (ref 135–145)
Total Bilirubin: 1.1 mg/dL (ref 0.3–1.2)
Total Protein: 6.7 g/dL (ref 6.5–8.1)

## 2018-02-26 LAB — URINALYSIS, ROUTINE W REFLEX MICROSCOPIC
BILIRUBIN URINE: NEGATIVE
Bacteria, UA: NONE SEEN
Glucose, UA: NEGATIVE mg/dL
Ketones, ur: NEGATIVE mg/dL
NITRITE: NEGATIVE
Protein, ur: NEGATIVE mg/dL
RBC / HPF: >50 RBC/hpf — ABNORMAL HIGH (ref 0–5)
Specific Gravity, Urine: 1.018 (ref 1.005–1.030)
pH: 7 (ref 5.0–8.0)

## 2018-02-26 LAB — CBC WITH DIFFERENTIAL/PLATELET
Basophils Absolute: 0 10*3/uL (ref 0.0–0.1)
Basophils Relative: 0 %
Eosinophils Absolute: 0 10*3/uL (ref 0.0–0.7)
Eosinophils Relative: 0 %
HCT: 43.2 % (ref 39.0–52.0)
Hemoglobin: 15 g/dL (ref 13.0–17.0)
Lymphocytes Relative: 8 %
Lymphs Abs: 1 10*3/uL (ref 0.7–4.0)
MCH: 29.3 pg (ref 26.0–34.0)
MCHC: 34.7 g/dL (ref 30.0–36.0)
MCV: 84.4 fL (ref 78.0–100.0)
Monocytes Absolute: 0.4 10*3/uL (ref 0.1–1.0)
Monocytes Relative: 3 %
Neutro Abs: 11.4 10*3/uL — ABNORMAL HIGH (ref 1.7–7.7)
Neutrophils Relative %: 89 %
Platelets: 234 10*3/uL (ref 150–400)
RBC: 5.12 MIL/uL (ref 4.22–5.81)
RDW: 12.3 % (ref 11.5–15.5)
WBC: 12.9 10*3/uL — ABNORMAL HIGH (ref 4.0–10.5)

## 2018-02-26 LAB — I-STAT CHEM 8, ED
BUN: 14 mg/dL (ref 6–20)
CHLORIDE: 103 mmol/L (ref 98–111)
Calcium, Ion: 1.23 mmol/L (ref 1.15–1.40)
Creatinine, Ser: 1.5 mg/dL — ABNORMAL HIGH (ref 0.61–1.24)
Glucose, Bld: 141 mg/dL — ABNORMAL HIGH (ref 70–99)
HEMATOCRIT: 46 % (ref 39.0–52.0)
Hemoglobin: 15.6 g/dL (ref 13.0–17.0)
Potassium: 4.3 mmol/L (ref 3.5–5.1)
SODIUM: 138 mmol/L (ref 135–145)
TCO2: 24 mmol/L (ref 22–32)

## 2018-02-26 MED ORDER — TAMSULOSIN HCL 0.4 MG PO CAPS: 0.4000 mg | ORAL_CAPSULE | Freq: Every day | ORAL | 0 refills | Status: DC

## 2018-02-26 MED ORDER — CEPHALEXIN 500 MG PO CAPS: 500.0000 mg | ORAL_CAPSULE | Freq: Two times a day (BID) | ORAL | 0 refills | Status: DC

## 2018-02-26 MED ORDER — SODIUM CHLORIDE 0.9 % IV BOLUS
1000.0000 mL | Freq: Once | INTRAVENOUS | Status: DC
  Administered 2018-02-26: 1000 mL via INTRAVENOUS

## 2018-02-26 MED ORDER — ONDANSETRON 4 MG PO TBDP: 4.0000 mg | ORAL_TABLET | Freq: Three times a day (TID) | ORAL | 0 refills | Status: DC | PRN

## 2018-02-26 MED ORDER — IBUPROFEN 400 MG PO TABS: 400.0000 mg | ORAL_TABLET | Freq: Four times a day (QID) | ORAL | 0 refills | Status: DC | PRN

## 2018-02-26 MED ORDER — ONDANSETRON 4 MG PO TBDP: 4.0000 mg | ORAL_TABLET | Freq: Three times a day (TID) | ORAL | 0 refills | Status: AC | PRN

## 2018-02-26 MED ORDER — OXYCODONE HCL 5 MG PO TABS
10.0000 mg | ORAL_TABLET | Freq: Once | ORAL | Status: AC
  Administered 2018-02-26: 10 mg via ORAL
  Filled 2018-02-26: qty 2

## 2018-02-26 MED ORDER — MORPHINE SULFATE (PF) 4 MG/ML IV SOLN
4.0000 mg | Freq: Once | INTRAVENOUS | Status: AC
  Administered 2018-02-26: 4 mg via INTRAVENOUS
  Filled 2018-02-26: qty 1

## 2018-02-26 MED ORDER — SODIUM CHLORIDE 0.9 % IV SOLN
1.0000 g | Freq: Once | INTRAVENOUS | Status: AC
  Administered 2018-02-26: 1 g via INTRAVENOUS
  Filled 2018-02-26: qty 10

## 2018-02-26 MED ORDER — ONDANSETRON HCL 4 MG/2ML IJ SOLN
4.0000 mg | Freq: Once | INTRAMUSCULAR | Status: DC
Start: 2018-02-26 — End: 2018-02-26

## 2018-02-26 MED ORDER — KETOROLAC TROMETHAMINE 30 MG/ML IJ SOLN
15.0000 mg | Freq: Once | INTRAMUSCULAR | Status: AC
  Administered 2018-02-26: 15 mg via INTRAVENOUS
  Filled 2018-02-26: qty 1

## 2018-02-26 MED ORDER — TAMSULOSIN HCL 0.4 MG PO CAPS: 0.4000 mg | ORAL_CAPSULE | Freq: Every day | ORAL | 0 refills | Status: AC

## 2018-02-26 MED ORDER — HYDROMORPHONE HCL 1 MG/ML IJ SOLN: 1.0000 mg | Freq: Once | INTRAMUSCULAR | Status: DC

## 2018-02-26 MED ORDER — MORPHINE SULFATE (PF) 2 MG/ML IV SOLN
2.0000 mg | INTRAVENOUS | Status: DC | PRN
  Administered 2018-02-26 – 2018-02-27 (×2): 2 mg via INTRAVENOUS
  Filled 2018-02-26 (×2): qty 1

## 2018-02-26 MED ORDER — SODIUM CHLORIDE 0.9 % IV BOLUS
500.0000 mL | Freq: Once | INTRAVENOUS | Status: AC
  Administered 2018-02-26: 500 mL via INTRAVENOUS

## 2018-02-26 MED ORDER — OXYCODONE-ACETAMINOPHEN 5-325 MG PO TABS: 1.0000 | ORAL_TABLET | ORAL | 0 refills | Status: DC | PRN

## 2018-02-26 MED ORDER — METOCLOPRAMIDE HCL 5 MG/ML IJ SOLN
10.0000 mg | Freq: Once | INTRAMUSCULAR | Status: AC
  Administered 2018-02-26: 10 mg via INTRAVENOUS
  Filled 2018-02-26: qty 2

## 2018-02-26 MED ORDER — ONDANSETRON HCL 4 MG/2ML IJ SOLN
4.0000 mg | Freq: Once | INTRAMUSCULAR | Status: AC
  Administered 2018-02-26: 4 mg via INTRAVENOUS
  Filled 2018-02-26: qty 2

## 2018-02-26 NOTE — ED Notes (Signed)
Pt has urine culture in lab as well if needed

## 2018-02-26 NOTE — H&P (Signed)
Mike Pena:[id number] DOB: August 25, 1965 DOA: 02/26/2018     PCP: Orpah Melter, MD   Outpatient Specialists:    Urology Dr. Alinda Money  Patient arrived to ER on 02/26/18 at 1905  Patient coming from: home Lives  With family    Chief Complaint:  Chief Complaint  Patient presents with  . Flank Pain    HPI: Mike Pena is a 52 y.o. male with medical history significant of kidney stones in the past requiring lithotripsy x2    Presented with   left-sided flank pain started few days ago was waxing and waning initially but became constant yesterday patient try to use oxycodone that he had a home they helped it a little bit but not completely resolved.  Pain became severe and he started to feel nauseous.  No fever associated with this he had been having some hematuria.  Significant for prior history of kidney stones with similar presentation.  He was initially seen in the emergency department today in the morning.  At that time creatinine i-STAT was 1.5  CT imaging showed left obstructive uropathy with a 6 x 6 mm proximal ureteral stone causing moderate hydronephrosis as well as perinephric edema   patient was given Rocephin IV and discharged home with follow-up with urology When patient went home pain resumed and he was not able to tolerate p.o. nauseous again at 7 PM he came back to emergency department by EMS he was treated with Zofran and fentanyl still no fevers or chills no associated shortness of breath or chest pain.  Regarding pertinent Chronic problems:  Prior history of kidney stones in the past requiring lithotripsy followed by urology   While in ER: Pain treated with morphine Reglan  15mg  of Toradol and 10 mg of oxycodone The following Work up has been ordered so far:  Orders Placed This Encounter  Procedures  . Comprehensive metabolic panel  . CBC with Differential  . Consult to urology  . Consult to hospitalist    Following Medications were ordered in  ER: Medications  morphine 4 MG/ML injection 4 mg (4 mg Intravenous Given 02/26/18 1945)  metoCLOPramide (REGLAN) injection 10 mg (10 mg Intravenous Given 02/26/18 2011)  sodium chloride 0.9 % bolus 500 mL (500 mLs Intravenous New Bag/Given 02/26/18 2119)  ketorolac (TORADOL) 30 MG/ML injection 15 mg (15 mg Intravenous Given 02/26/18 2119)  oxyCODONE (Oxy IR/ROXICODONE) immediate release tablet 10 mg (10 mg Oral Given 02/26/18 2119)    Significant initial  Findings: Abnormal Labs Reviewed  COMPREHENSIVE METABOLIC PANEL - Abnormal; Notable for the following components:      Result Value   Glucose, Bld 146 (*)    Creatinine, Ser 1.66 (*)    GFR calc non Af Amer 46 (*)    GFR calc Af Amer 54 (*)    All other components within normal limits  CBC WITH DIFFERENTIAL/PLATELET - Abnormal; Notable for the following components:   WBC 12.9 (*)    Neutro Abs 11.4 (*)    All other components within normal limits     Na 142 K 4.7  Cr    Up from baseline see below Lab Results  Component Value Date   CREATININE 1.66 (H) 02/26/2018   CREATININE 1.50 (H) 02/26/2018   CREATININE 1.20 07/28/2014      WBC  12.9 HG/HCT   Stable     Component Value Date/Time   HGB 15.0 02/26/2018 1949   HCT 43.2 02/26/2018 1949  Lactic Acid, Venous No results found for: LATICACIDVEN    UA   no evidence of UTI hematuria   CTabd/pelvis - 6 mm Left Proximal stone  ECG:  Not obtained  ED Triage Vitals  Enc Vitals Group     BP 02/26/18 1930 133/70     Pulse Rate 02/26/18 1930 80     Resp 02/26/18 1930 18     Temp --      Temp src --      SpO2 02/26/18 1920 96 %     Weight --      Height --      Head Circumference --      Peak Flow --      Pain Score 02/26/18 1925 7     Pain Loc --      Pain Edu? --      Excl. in [place]? --   TMAX(24)@       Latest  Blood pressure 133/70, pulse 80, resp. rate 18, SpO2 92 %.   ER Provider Called:  Nelly Laurence They Recommend admit for observation Will  see in AM   Hospitalist was called for admission for nephrolethiasis    Review of Systems:    Pertinent positives include: nausea,  change in color of urine, flank pain. Constitutional:  No weight loss, night sweats, Fevers, chills, fatigue, weight loss  HEENT:  No headaches, Difficulty swallowing,Tooth/dental problems,Sore throat,  No sneezing, itching, ear ache, nasal congestion, post nasal drip,  Cardio-vascular:  No chest pain, Orthopnea, PND, anasarca, dizziness, palpitations.no Bilateral lower extremity swelling  GI:  No heartburn, indigestion, abdominal pain, vomiting, diarrhea, change in bowel habits, loss of appetite, melena, blood in stool, hematemesis Resp:  no shortness of breath at rest. No dyspnea on exertion, No excess mucus, no productive cough, No non-productive cough, No coughing up of blood.No change in color of mucus.No wheezing. Skin:  no rash or lesions. No jaundice GU:  no dysuria, no urgency or frequency. No straining to urinate.  No   Musculoskeletal:  No joint pain or no joint swelling. No decreased range of motion. No back pain.  Psych:  No change in mood or affect. No depression or anxiety. No memory loss.  Neuro: no localizing neurological complaints, no tingling, no weakness, no double vision, no gait abnormality, no slurred speech, no confusion  All systems reviewed and apart from [place] all are negative  Past Medical History:   Past Medical History:  Diagnosis Date  . kidney stones       Past Surgical History:  Procedure Laterality Date  . EYE SURGERY     cataract surgery    Social History:  Ambulatory   independently      reports that he has never smoked. He does not have any smokeless tobacco history on file. He reports that he drinks alcohol. His drug history is not on file.     Family History:   Family History  Problem Relation Age of Onset  . Cancer Neg Hx   . Diabetes Neg Hx     Allergies: No Known  Allergies   Prior to Admission medications   Medication Sig Start Date End Date Taking? Authorizing Provider  HYDROcodone-acetaminophen (NORCO/VICODIN) 5-325 MG tablet Take 1 tablet by mouth every 6 (six) hours as needed for moderate pain.   Yes [provider]  KRILL OIL PO Take 1 capsule by mouth daily.   Yes [provider]  oxycodone (OXY-IR) 5 MG capsule  Take 5 mg by mouth every 4 (four) hours as needed for pain.   Yes [provider]  tamsulosin (FLOMAX) 0.4 MG CAPS capsule Take 1 capsule (0.4 mg total) by mouth daily. 02/26/18  Yes Nuala Alpha A, PA-C  cephALEXin (KEFLEX) 500 MG capsule Take 1 capsule (500 mg total) by mouth 2 (two) times daily. 02/26/18   Nuala Alpha A, PA-C  ondansetron (ZOFRAN ODT) 4 MG disintegrating tablet Take 1 tablet (4 mg total) by mouth every 8 (eight) hours as needed for nausea or vomiting. 02/26/18   Nuala Alpha A, PA-C  oxyCODONE-acetaminophen (PERCOCET/ROXICET) 5-325 MG tablet Take 1 tablet by mouth every 4 (four) hours as needed for severe pain. 02/26/18   Deliah Boston, PA-C   Physical Exam: Blood pressure 133/70, pulse 80, resp. rate 18, SpO2 92 %. 1. General:  in No Acute distress  well   -appearing 2. Psychological: Alert and  Oriented 3. Head/ENT:    Dry Mucous Membranes                          Head Non traumatic, neck supple                           Poor Dentition 4. SKIN:  decreased Skin turgor,  Skin clean Dry and intact no rash 5. Heart: Regular rate and rhythm no  Murmur, no Rub or gallop 6. Lungs:  Clear to auscultation bilaterally, no wheezes or crackles   7. Abdomen: Soft,  non-tender, Non distended bowel sounds present 8. Lower extremities: no clubbing, cyanosis, or  edema 9. Neurologically Grossly intact, moving all 4 extremities equally  10. MSK: Normal range of motion   LABS:     Recent Labs  Lab 02/26/18 0512 02/26/18 1949  WBC  --  12.9*  NEUTROABS  --  11.4*  HGB 15.6 15.0  HCT  46.0 43.2  MCV  --  84.4  PLT  --  161   Basic Metabolic Panel: Recent Labs  Lab 02/26/18 0512 02/26/18 1949  NA 138 142  K 4.3 4.7  CL 103 106  CO2  --  24  GLUCOSE 141* 146*  BUN 14 18  CREATININE 1.50* 1.66*  CALCIUM  --  9.1      Recent Labs  Lab 02/26/18 1949  AST 30  ALT 27  ALKPHOS 50  BILITOT 1.1  PROT 6.7  ALBUMIN 4.2   No results for input(s): LIPASE, AMYLASE in the last 168 hours. No results for input(s): AMMONIA in the last 168 hours.    HbA1C: No results for input(s): HGBA1C in the last 72 hours. CBG: No results for input(s): GLUCAP in the last 168 hours.    Urine analysis:    Component Value Date/Time   COLORURINE YELLOW 02/26/2018 0504   APPEARANCEUR CLEAR 02/26/2018 0504   LABSPEC 1.018 02/26/2018 0504   PHURINE 7.0 02/26/2018 0504   GLUCOSEU NEGATIVE 02/26/2018 0504   HGBUR LARGE (A) 02/26/2018 0504   BILIRUBINUR NEGATIVE 02/26/2018 0504   KETONESUR NEGATIVE 02/26/2018 0504   PROTEINUR NEGATIVE 02/26/2018 0504   UROBILINOGEN 1.0 07/28/2014 1448   NITRITE NEGATIVE 02/26/2018 0504   LEUKOCYTESUR TRACE (A) 02/26/2018 0504       Cultures: No results found for: SDES, SPECREQUEST, CULT, REPTSTATUS   Radiological Exams on Admission: Ct Renal Stone Study  Result Date: 02/26/2018 CLINICAL DATA:  Left flank pain EXAM: CT ABDOMEN AND PELVIS  WITHOUT CONTRAST TECHNIQUE: Multidetector CT imaging of the abdomen and pelvis was performed following the standard protocol without IV contrast. COMPARISON:  CT abdomen pelvis 07/28/2014 FINDINGS: LOWER CHEST: There is no basilar pleural or apical pericardial effusion. HEPATOBILIARY: The hepatic contours and density are normal. There is no intra- or extrahepatic biliary dilatation. The gallbladder is normal. PANCREAS: The pancreatic parenchymal contours are normal and there is no ductal dilatation. There is no peripancreatic fluid collection. SPLEEN: Normal. ADRENALS/URINARY TRACT: --Adrenal glands: Normal.  --Right kidney/ureter: No hydronephrosis, nephroureterolithiasis, perinephric stranding or solid renal mass. --Left kidney/ureter: There is a 6 x 6 mm obstructing stone in the proximal left ureter, causing moderate hydroureteronephrosis and perinephric edema. There is an additional, nonobstructive renal calculus that measures 6 mm. --Urinary bladder: Normal for degree of distention STOMACH/BOWEL: --Stomach/Duodenum: There is no hiatal hernia or other gastric abnormality. The duodenal course and caliber are normal. --Small bowel: No dilatation or inflammation. --Colon: No focal abnormality. --Appendix: Normal. VASCULAR/LYMPHATIC: Normal course and caliber of the major abdominal vessels. No abdominal or pelvic lymphadenopathy. REPRODUCTIVE: Enlarged prostate measures 6.3 cm in transverse dimension. MUSCULOSKELETAL. No bony spinal canal stenosis or focal osseous abnormality. OTHER: None. IMPRESSION: 1. Left obstructive uropathy with 6 x 6 mm proximal ureteral stone causing moderate hydroureteronephrosis and perinephric edema. 2. Nonobstructive left renal calculus measuring 6 mm. 3. Enlarged prostate, measuring 6.3 cm. Electronically Signed   By: Ulyses Jarred M.D.   On: 02/26/2018 06:04    Chart has been reviewed    Assessment/Plan  52 y.o. male with medical history significant of kidney stones in the past requiring lithotripsy x2    Admitted for kidney stone pain management and AKI  Present on Admission:  . Urolithiasis -continue supportive management with pain medication Zofran and IV fluids currently seems to have pain in the well-controlled after IV pain medications administered . AKI (acute kidney injury) ([place]) will rehydrate and continue to follow appreciate urology input . Leukocytosis -no evidence of UTI no fevers or chills Per ER provider urology recommended holding off on antibiotics at this point we will continue to follow suspect hemoconcentration  Other plan as per orders.  DVT  prophylaxis:  SCD   Code Status:  FULL CODE  as per patient      Family Communication:   Family   at  Bedside  plan of care was discussed with  Wife, Disposition Plan:   To home once workup is complete and patient is stable     Consults called: Urology is aware    Obs    Level of care       medical floor        Toy Baker 02/27/2018, 12:07 AM    [facility]  Pager [phone]   after 2 AM please page floor coverage PA If 7AM-7PM, please contact the day team taking care of the patient  [email or web address]  Password [email or web address]

## 2018-02-26 NOTE — ED Provider Notes (Signed)
MSE was initiated and I personally evaluated the patient and placed orders (if any) at  5:25 AM on September 59, 6191.  52 year old male with a history of ureterolithiasis presents to the emergency department for evaluation of left-sided flank pain.  States that pain began a few days ago and was waxing and waning in severity.  Became more constant yesterday.  Was slightly improved with use of leftover oxycodone from 2016, but pain never completely resolved.  Patient reports acute worsening 1 to 2 hours ago causing him to feel nauseous.  He experienced diaphoresis as well as pain in his left flank and left lower quadrant.  Did not have any vomiting.  Denies preceding fever, dysuria.  Did notice some hematuria a few days ago.  Believes that he may have another kidney stone.  Has required lithotripsy for management in the past.  Has yearly follow-up with his urologist next week.  Patient currently pain-free.  Will hold medications.  Last CT scan was in 2016.  Will repeat this today.  Chemistry panel ordered in triage with slight elevation of creatinine, possibly secondary to obstruction.  Urinalysis pending.  The patient appears stable so that the remainder of the MSE may be completed by another provider.   Antonietta Breach, PA-C 02/26/18 [id number]    Rolland Porter, MD 02/26/18 [phone]

## 2018-02-26 NOTE — ED Provider Notes (Signed)
[facility] DEPT Provider Note   CSN: [id number] Arrival date & time: 02/26/18  0443     History   Chief Complaint Chief Complaint  Patient presents with  . Flank Pain    left    HPI Mike Pena is a 52 y.o. male with history of kidney stones presented to the emergency department for left-sided flank pain.  Patient states that the pain began 3-4 days ago, sharp and severe in nature and intermittent.  Patient also states that he had some hematuria 2 days ago.  Patient states that last night the pain became stronger and he took some oxycodone left over from his previous kidney stone with relief.  Patient endorses some nausea however denies vomiting with the pain.  Patient states that he has a history of kidney stones and lithotripsy.  Patient states that he was last seen by his urologist 1 week ago.  Patient denies fever, dysuria, vomiting, diarrhea.  HPI  Past Medical History:  Diagnosis Date  . kidney stones     There are no active problems to display for this patient.   Past Surgical History:  Procedure Laterality Date  . EYE SURGERY     cataract surgery        Home Medications    Prior to Admission medications   Medication Sig Start Date End Date Taking? Authorizing Provider  KRILL OIL PO Take 1 capsule by mouth daily.   Yes [provider]  oxycodone (OXY-IR) 5 MG capsule Take 5 mg by mouth every 4 (four) hours as needed for pain.   Yes [provider]  cephALEXin (KEFLEX) 500 MG capsule Take 1 capsule (500 mg total) by mouth 2 (two) times daily. 02/26/18   Nuala Alpha A, PA-C  ondansetron (ZOFRAN ODT) 4 MG disintegrating tablet Take 1 tablet (4 mg total) by mouth every 8 (eight) hours as needed for nausea or vomiting. 02/26/18   Nuala Alpha A, PA-C  oxyCODONE-acetaminophen (PERCOCET/ROXICET) 5-325 MG tablet Take 1 tablet by mouth every 4 (four) hours as needed for severe pain. 02/26/18   Nuala Alpha A,  PA-C  tamsulosin (FLOMAX) 0.4 MG CAPS capsule Take 1 capsule (0.4 mg total) by mouth daily. 02/26/18   Deliah Boston, PA-C    Family History No family history on file.  Social History Social History   Tobacco Use  . Smoking status: Never Smoker  Substance Use Topics  . Alcohol use: Yes  . Drug use: Not on file     Allergies   Patient has no known allergies.   Review of Systems Review of Systems  Constitutional: Negative.  Negative for chills and fever.  HENT: Negative.  Negative for rhinorrhea and sore throat.   Eyes: Negative.  Negative for visual disturbance.  Respiratory: Negative.  Negative for cough and shortness of breath.   Cardiovascular: Negative.  Negative for chest pain.  Gastrointestinal: Positive for nausea. Negative for abdominal pain, blood in stool, diarrhea and vomiting.  Genitourinary: Positive for flank pain. Negative for dysuria, hematuria, scrotal swelling and testicular pain.  Musculoskeletal: Negative for arthralgias and myalgias.  Skin: Negative.  Negative for rash.  Neurological: Negative.  Negative for dizziness, weakness and headaches.     Physical Exam Updated Vital Signs BP 124/81   Pulse (!) 53   Temp 98 F (36.7 C) (Oral)   Resp 13   Ht 5\' 10"  (1.778 m)   Wt 95.3 kg   SpO2 98%   BMI 30.13 kg/m  Physical Exam  Constitutional: He is oriented to person, place, and time. He appears well-developed and well-nourished. No distress.  HENT:  Head: Normocephalic and atraumatic.  Right Ear: External ear normal.  Left Ear: External ear normal.  Nose: Nose normal.  Eyes: Pupils are equal, round, and reactive to light. EOM are normal.  Neck: Trachea normal and normal range of motion. No tracheal deviation present.  Cardiovascular: Normal rate, regular rhythm, normal heart sounds and intact distal pulses.  Pulmonary/Chest: Effort normal and breath sounds normal. No respiratory distress.  Abdominal: Soft. Bowel sounds are normal. There  is no tenderness. There is CVA tenderness. There is no rigidity, no rebound and no guarding.  Genitourinary:  Genitourinary Comments: Refused by patient  Musculoskeletal: Normal range of motion.  Neurological: He is alert and oriented to person, place, and time.  Skin: Skin is warm and dry.  Psychiatric: He has a normal mood and affect. His behavior is normal.     ED Treatments / Results  Labs (all labs ordered are listed, but only abnormal results are displayed) Labs Reviewed  URINALYSIS, ROUTINE W REFLEX MICROSCOPIC - Abnormal; Notable for the following components:      Result Value   Hgb urine dipstick LARGE (*)    Leukocytes, UA TRACE (*)    RBC / HPF >50 (*)    All other components within normal limits  I-STAT CHEM 8, ED - Abnormal; Notable for the following components:   Creatinine, Ser 1.50 (*)    Glucose, Bld 141 (*)    All other components within normal limits  URINE CULTURE    EKG None  Radiology Ct Renal Stone Study  Result Date: 02/26/2018 CLINICAL DATA:  Left flank pain EXAM: CT ABDOMEN AND PELVIS WITHOUT CONTRAST TECHNIQUE: Multidetector CT imaging of the abdomen and pelvis was performed following the standard protocol without IV contrast. COMPARISON:  CT abdomen pelvis 07/28/2014 FINDINGS: LOWER CHEST: There is no basilar pleural or apical pericardial effusion. HEPATOBILIARY: The hepatic contours and density are normal. There is no intra- or extrahepatic biliary dilatation. The gallbladder is normal. PANCREAS: The pancreatic parenchymal contours are normal and there is no ductal dilatation. There is no peripancreatic fluid collection. SPLEEN: Normal. ADRENALS/URINARY TRACT: --Adrenal glands: Normal. --Right kidney/ureter: No hydronephrosis, nephroureterolithiasis, perinephric stranding or solid renal mass. --Left kidney/ureter: There is a 6 x 6 mm obstructing stone in the proximal left ureter, causing moderate hydroureteronephrosis and perinephric edema. There is an  additional, nonobstructive renal calculus that measures 6 mm. --Urinary bladder: Normal for degree of distention STOMACH/BOWEL: --Stomach/Duodenum: There is no hiatal hernia or other gastric abnormality. The duodenal course and caliber are normal. --Small bowel: No dilatation or inflammation. --Colon: No focal abnormality. --Appendix: Normal. VASCULAR/LYMPHATIC: Normal course and caliber of the major abdominal vessels. No abdominal or pelvic lymphadenopathy. REPRODUCTIVE: Enlarged prostate measures 6.3 cm in transverse dimension. MUSCULOSKELETAL. No bony spinal canal stenosis or focal osseous abnormality. OTHER: None. IMPRESSION: 1. Left obstructive uropathy with 6 x 6 mm proximal ureteral stone causing moderate hydroureteronephrosis and perinephric edema. 2. Nonobstructive left renal calculus measuring 6 mm. 3. Enlarged prostate, measuring 6.3 cm. Electronically Signed   By: Ulyses Jarred M.D.   On: 02/26/2018 06:04    Procedures Procedures (including critical care time)  Medications Ordered in ED Medications  ondansetron (ZOFRAN) injection 4 mg (has no administration in time range)  cefTRIAXone (ROCEPHIN) 1 g in sodium chloride 0.9 % 100 mL IVPB (0 g Intravenous Stopped 02/26/18 0750)     Initial  Impression / Assessment and Plan / ED Course  I have reviewed the triage vital signs and the nursing notes.  Pertinent labs & imaging results that were available during my care of the patient were reviewed by me and considered in my medical decision making (see chart for details).  Clinical Course as of Feb 26 753  Mon Feb 26, 2018  0729 Patient states that he has a is seen by [facility] and is able to contact their office today to schedule an appointment for today regarding his kidney stone.   [BM]    Clinical Course User Index [BM] Deliah Boston, PA-C   Patient with history of kidney stones presenting today with 1. Left obstructive uropathy with 6 x 6 mm proximal ureteral stone  causing moderate hydroureteronephrosis and perinephric edema. 2. Nonobstructive left renal calculus measuring 6 mm. 3. Enlarged prostate, measuring 6.3 cm.Urinalysis shows hemoglobin in the urine Creatinine of 1.5 previous creatinine 3 years ago of 1.2. Patient is afebrile, not tachycardic, not hypotensive, well-appearing no acute distress in emergency department at time of evaluation.  Labs and imaging discussed with Dr. Tomi Bamberger who advises 1 g of Rocephin here discharge with oral antibiotics, flow max, pain control and discharge close urology follow-up.     Patient is afebrile, not tachycardic, not hypotensive, pain controlled at this time, patient denying pain.  Patient with some nausea however no vomiting in ED. Nausea controlled with Zofran.  Doubt infection at this time, patient covered empirically, encouraged prompt urology follow-up and given stone precautions including fever, uncontrolled pain, confusion and vomiting. Urine culture sent.  Patient provided with Percocet prescription and informed not to drive while taking this medication.  Patient prescribed tamsulosin, Keflex, Percocet. Patient informed to drink plenty of water and call his urologist today to schedule a follow-up appointment for today.  Patient informed to return to the emergency department for fevers, worsening of pain, vomiting or any other new or worsening symptoms.  At this time there does not appear to be any evidence of an acute emergency medical condition and the patient appears stable for discharge with appropriate outpatient follow up. Diagnosis was discussed with patient who verbalizes understanding of care plan and is agreeable to discharge. I have discussed return precautions with patient who verbalize understanding of return precautions. Patient strongly encouraged to follow-up with their PCP. All questions answered.  Patient's case discussed with Dr. Tomi Bamberger who agrees with plan to discharge with follow-up.      Note: Portions of this report may have been transcribed using voice recognition software. Every effort was made to ensure accuracy; however, inadvertent computerized transcription errors may still be present.  Final Clinical Impressions(s) / ED Diagnoses   Final diagnoses:  Ureterolithiasis  Nephrolithiasis  Enlarged prostate    ED Discharge Orders         Ordered    oxyCODONE-acetaminophen (PERCOCET/ROXICET) 5-325 MG tablet  Every 4 hours PRN,   Status:  Discontinued     02/26/18 0631    ibuprofen (ADVIL,MOTRIN) 400 MG tablet  Every 6 hours PRN,   Status:  Discontinued     02/26/18 0631    tamsulosin (FLOMAX) 0.4 MG CAPS capsule  Daily,   Status:  Discontinued     02/26/18 0638    cephALEXin (KEFLEX) 500 MG capsule  2 times daily,   Status:  Discontinued     02/26/18 0638    ondansetron (ZOFRAN ODT) 4 MG disintegrating tablet  Every 8 hours PRN,   Status:  Discontinued     02/26/18 0730    cephALEXin (KEFLEX) 500 MG capsule  2 times daily     02/26/18 0740    ondansetron (ZOFRAN ODT) 4 MG disintegrating tablet  Every 8 hours PRN     02/26/18 0740    oxyCODONE-acetaminophen (PERCOCET/ROXICET) 5-325 MG tablet  Every 4 hours PRN     02/26/18 0740    tamsulosin (FLOMAX) 0.4 MG CAPS capsule  Daily     02/26/18 0740           Deliah Boston, PA-C 02/26/18 8550    Rolland Porter, MD 02/26/18 2258

## 2018-02-26 NOTE — ED Triage Notes (Signed)
Pt BIB [facility] from home. Pt was diagnosed with 2 left sided kidney stones. Pt has been taking his pain medication and stated that it is not helping. Pt nauseous. EMS gave 4 mg Zofran and 224mcg of fentanyl that decreased his pain from a 10 to a 6/10.

## 2018-02-26 NOTE — ED Provider Notes (Signed)
[facility] DEPT Provider Note   CSN: [id number] Arrival date & time: 02/26/18  1905     History   Chief Complaint Chief Complaint  Patient presents with  . Flank Pain    HPI Mike Pena is a 52 y.o. male with history of kidney stones who was seen earlier today and diagnosed with a left proximal ureteral stone measuring 6 mm x 6 mm, who presents with continued pain.  He reports his pain medication at home, Percocet Flomax, Zofran is not helping.  He is also nauseous.  EMS gave 4 mg of Zofran and 200 mcg of fentanyl.  He reports initially helped his pain, however it is returning.  He reports that his stomach is upset and is having some left lower pain.  He denies urinary symptoms.  He denies any fevers, chest pain, shortness of breath.  He is followed by [facility].  HPI  Past Medical History:  Diagnosis Date  . kidney stones     Patient Active Problem List   Diagnosis Date Noted  . AKI (acute kidney injury) ([place]) 02/26/2018    Past Surgical History:  Procedure Laterality Date  . EYE SURGERY     cataract surgery        Home Medications    Prior to Admission medications   Medication Sig Start Date End Date Taking? Authorizing Provider  HYDROcodone-acetaminophen (NORCO/VICODIN) 5-325 MG tablet Take 1 tablet by mouth every 6 (six) hours as needed for moderate pain.   Yes [provider]  KRILL OIL PO Take 1 capsule by mouth daily.   Yes [provider]  oxycodone (OXY-IR) 5 MG capsule Take 5 mg by mouth every 4 (four) hours as needed for pain.   Yes [provider]  tamsulosin (FLOMAX) 0.4 MG CAPS capsule Take 1 capsule (0.4 mg total) by mouth daily. 02/26/18  Yes Nuala Alpha A, PA-C  cephALEXin (KEFLEX) 500 MG capsule Take 1 capsule (500 mg total) by mouth 2 (two) times daily. 02/26/18   Nuala Alpha A, PA-C  ondansetron (ZOFRAN ODT) 4 MG disintegrating tablet Take 1 tablet (4 mg total) by mouth every  8 (eight) hours as needed for nausea or vomiting. 02/26/18   Nuala Alpha A, PA-C  oxyCODONE-acetaminophen (PERCOCET/ROXICET) 5-325 MG tablet Take 1 tablet by mouth every 4 (four) hours as needed for severe pain. 02/26/18   Deliah Boston, PA-C    Family History No family history on file.  Social History Social History   Tobacco Use  . Smoking status: Never Smoker  Substance Use Topics  . Alcohol use: Yes  . Drug use: Not on file     Allergies   Patient has no known allergies.   Review of Systems Review of Systems  Constitutional: Negative for chills and fever.  HENT: Negative for facial swelling and sore throat.   Respiratory: Negative for shortness of breath.   Cardiovascular: Negative for chest pain.  Gastrointestinal: Positive for abdominal pain, nausea and vomiting.  Genitourinary: Positive for flank pain. Negative for dysuria.  Musculoskeletal: Negative for back pain.  Skin: Negative for rash and wound.  Neurological: Negative for headaches.  Psychiatric/Behavioral: The patient is not nervous/anxious.      Physical Exam Updated Vital Signs BP 133/70 (BP Location: Right Arm)   Pulse 80   Resp 18   SpO2 92%   Physical Exam  Constitutional: He appears well-developed and well-nourished. No distress.  HENT:  Head: Normocephalic and atraumatic.  Mouth/Throat: Oropharynx  is clear and moist. No oropharyngeal exudate.  Eyes: Pupils are equal, round, and reactive to light. Conjunctivae are normal. Right eye exhibits no discharge. Left eye exhibits no discharge. No scleral icterus.  Neck: Normal range of motion. Neck supple. No thyromegaly present.  Cardiovascular: Normal rate, regular rhythm, normal heart sounds and intact distal pulses. Exam reveals no gallop and no friction rub.  No murmur heard. Pulmonary/Chest: Effort normal and breath sounds normal. No stridor. No respiratory distress. He has no wheezes. He has no rales.  Abdominal: Soft. Bowel sounds are  normal. He exhibits no distension. There is no tenderness. There is no rebound and no guarding.  Musculoskeletal: He exhibits no edema.  Lymphadenopathy:    He has no cervical adenopathy.  Neurological: He is alert. Coordination normal.  Skin: Skin is warm and dry. No rash noted. He is not diaphoretic. No pallor.  Psychiatric: He has a normal mood and affect.  Nursing note and vitals reviewed.    ED Treatments / Results  Labs (all labs ordered are listed, but only abnormal results are displayed) Labs Reviewed  COMPREHENSIVE METABOLIC PANEL - Abnormal; Notable for the following components:      Result Value   Glucose, Bld 146 (*)    Creatinine, Ser 1.66 (*)    GFR calc non Af Amer 46 (*)    GFR calc Af Amer 54 (*)    All other components within normal limits  CBC WITH DIFFERENTIAL/PLATELET - Abnormal; Notable for the following components:   WBC 12.9 (*)    Neutro Abs 11.4 (*)    All other components within normal limits    EKG None  Radiology Ct Renal Stone Study  Result Date: 02/26/2018 CLINICAL DATA:  Left flank pain EXAM: CT ABDOMEN AND PELVIS WITHOUT CONTRAST TECHNIQUE: Multidetector CT imaging of the abdomen and pelvis was performed following the standard protocol without IV contrast. COMPARISON:  CT abdomen pelvis 07/28/2014 FINDINGS: LOWER CHEST: There is no basilar pleural or apical pericardial effusion. HEPATOBILIARY: The hepatic contours and density are normal. There is no intra- or extrahepatic biliary dilatation. The gallbladder is normal. PANCREAS: The pancreatic parenchymal contours are normal and there is no ductal dilatation. There is no peripancreatic fluid collection. SPLEEN: Normal. ADRENALS/URINARY TRACT: --Adrenal glands: Normal. --Right kidney/ureter: No hydronephrosis, nephroureterolithiasis, perinephric stranding or solid renal mass. --Left kidney/ureter: There is a 6 x 6 mm obstructing stone in the proximal left ureter, causing moderate hydroureteronephrosis  and perinephric edema. There is an additional, nonobstructive renal calculus that measures 6 mm. --Urinary bladder: Normal for degree of distention STOMACH/BOWEL: --Stomach/Duodenum: There is no hiatal hernia or other gastric abnormality. The duodenal course and caliber are normal. --Small bowel: No dilatation or inflammation. --Colon: No focal abnormality. --Appendix: Normal. VASCULAR/LYMPHATIC: Normal course and caliber of the major abdominal vessels. No abdominal or pelvic lymphadenopathy. REPRODUCTIVE: Enlarged prostate measures 6.3 cm in transverse dimension. MUSCULOSKELETAL. No bony spinal canal stenosis or focal osseous abnormality. OTHER: None. IMPRESSION: 1. Left obstructive uropathy with 6 x 6 mm proximal ureteral stone causing moderate hydroureteronephrosis and perinephric edema. 2. Nonobstructive left renal calculus measuring 6 mm. 3. Enlarged prostate, measuring 6.3 cm. Electronically Signed   By: Ulyses Jarred M.D.   On: 02/26/2018 06:04    Procedures Procedures (including critical care time)  Medications Ordered in ED Medications  morphine 4 MG/ML injection 4 mg (4 mg Intravenous Given 02/26/18 1945)  metoCLOPramide (REGLAN) injection 10 mg (10 mg Intravenous Given 02/26/18 2011)  sodium chloride 0.9 % bolus  500 mL (500 mLs Intravenous New Bag/Given 02/26/18 2119)  ketorolac (TORADOL) 30 MG/ML injection 15 mg (15 mg Intravenous Given 02/26/18 2119)  oxyCODONE (Oxy IR/ROXICODONE) immediate release tablet 10 mg (10 mg Oral Given 02/26/18 2119)     Initial Impression / Assessment and Plan / ED Course  I have reviewed the triage vital signs and the nursing notes.  Pertinent labs & imaging results that were available during my care of the patient were reviewed by me and considered in my medical decision making (see chart for details).     Patient returning with severe pain from a proximal 6 x 6 mm ureteral stone.  Creatinine has bumped from 1.50-1.66 since this morning.  Patient's pain has  been intractable with the pain medication he had at home, Percocet, Flomax, ibuprofen.  He is improved here after morphine, Reglan, Toradol 15 mg, and oxycodone 10 mg.  Discussed patient case with urology who advised admission for observation overnight, pain control.  If not improved, plan for stent stent in the morning.  I spoke with Dr. Roel Cluck with [facility] who accepts patient for admission.  Patient and wife understand agree with plan. I discussed patient case with Dr. Sedonia Small who guided the patient's management and agrees with plan.   Final Clinical Impressions(s) / ED Diagnoses   Final diagnoses:  Ureterolithiasis    ED Discharge Orders    None       Frederica Kuster, PA-C 02/26/18 2246    Maudie Flakes, MD 02/27/18 [phone]

## 2018-02-26 NOTE — Discharge Instructions (Addendum)
Please return to the Emergency Department for any new or worsening symptoms or if your symptoms do not improve. Please be sure to follow up with your Primary Care Physician as soon as possible regarding your visit today. If you do not have a Primary Doctor please use the resources below to establish one. You MUST call your UROLOGIST TODAY to schedule a follow-up appointment for as soon as possible. They should get you an appointment for TODAY. Your CT scan today showed multiple kidney stones on the left side.  It is imperative that you follow-up with your urologist.  Please return to the emergency department for any new or worsening symptoms including but not limited to fever and vomiting. You may use the pain medication Percocet as prescribed please do not drive or operate heavy machinery while taking this medication it will make you drowsy.  Please also drink plenty of water to help with your symptoms. Please take the Flomax as prescribed to help with urination, please drink plenty of water. You may use the antinausea medication Zofran as prescribed. Please take the antibiotic Keflex as prescribed.  Contact a health care provider if: You have pain that gets worse or does not get better with medicine. Get help right away if: You have a fever or chills. You develop severe pain. You develop new abdominal pain. You faint. You are unable to urinate.  RESOURCE GUIDE  Chronic Pain Problems: Contact [facility]  [phone] Patients need to be referred by their primary care doctor.  Insufficient Money for Medicine: Contact [employer] Way:  call "211" or [place] [phone].  No Primary Care Doctor: Call [facility]  [phone] - can help you locate a primary care doctor that  accepts your insurance, provides certain services, etc. Physician Referral Service- [phone]  Agencies that provide inexpensive medical care: [facility]  [facility]  [phone] [facility]  [phone] [facility]  [phone] [facility]  [phone] [facility]  [phone]  [place] Providers: [facility]- [address], [address]  [phone], Mon-Fri 9am-7pm, Sat 9am-1pm [place], [address] [place]  [place], [address] [place]  [place]- [address]  [place], [address], [phone]  Only accepts [place] patients after they have their name  applied to their card  Self Pay (no insurance) in [place] [facility]: Sickle Cell Patients: Dr Kevan Ny, [facility] Internal [facility]  [place], [facility]- [place]  [place] [facility] [place]- [address], [facility]- see information above (Speak to [employer] if you do not have insurance)       -  [facility]- [place], [place],  [address], [phone]       -  Dr Vista Lawman-  [address], [place], [place] [facility]- [address], [phone]       -  [place], [place], also [address], [phone]       -    [facility]- [address], [place], 1st & 3rd Saturday   every month, 10am-1pm  1) Find a Doctor and Pay Out of Pocket Although you won't have to find out who is covered by your insurance plan, it is a good idea to ask around and get recommendations. You will then need to call the office and see if the doctor you have chosen will accept you as a new patient and what types of options they offer for patients who are self-pay.  Some doctors offer discounts or will set up payment plans for their patients who do not have insurance, but you will need to ask so you aren't surprised when you get to your appointment.  2) Contact Your Local Health Department Not all health departments have doctors that can see patients for sick visits, but many do, so it is worth a call to see if yours does. If you don't know where your local health department is, you can check in your phone book. The CDC also has a tool to help you locate your state's health department, and many state websites also have listings of all of their local health departments.  3) Find a [facility] If your illness is not likely to be very severe or complicated, you may want to try a walk in clinic. These are popping up all over the country in pharmacies, drugstores, and shopping centers. They're usually staffed by nurse practitioners or physician assistants that have been trained to treat common illnesses and complaints. They're usually fairly quick and inexpensive. However, if you have serious medical issues or chronic medical problems, these are probably not your best option  [facility] [place], [facility], [address], [place], phone [phone] or [phone].  Monday - Friday, call for an appointment. [place], [facility], [address], [place], phone [phone] or [phone].  Monday - Friday, call for an appointment.  Abuse/Neglect: [place] [phone] [place] [phone] (After Hours)  Emergency Shelter:  [facility] [phone]  Maternity Homes: Room at the [place] [phone] [place] [phone]  MRSA Hotline #:   2896306935  [facility]  Dept. [address]         [place] Phone:  [phone]                                  Phone:  [phone]  Phone:  [place], [place]- [phone]       -     [facility] in [place], [address]                                  [place] [phone] or [phone] (After Hours)   [place]  Substance Abuse Resources: Alcohol and Drug Services  [place] [phone] [facility] [place] Chinita Pester [phone] Residential & Outpatient Substance Abuse Program  [phone]  Psychological Services: [place]  [phone] [place]  [place], [place]. [address], [place], [place]: [phone] or [phone], [employer]  Dental Assistance  If unable to pay or uninsured, contact:  [facility] or [facility]. to become qualified for the adult dental clinic.  Patients with Medicaid: [facility] [phone] [address], [place] [address] [phone]  If unable to pay, or uninsured, contact [facility] [phone]) or [place] [phone] in [place], [place] in [facility]) to become qualified for the adult dental clinic   Other [place]- [place], [place], [place], [address], [place], [place], 2nd and 4th Thursday of the month at 6:30am.  10 clients each day by appointment, can sometimes see walk-in patients if someone does not show for an appointment. [facility]- [address] [place] D[place]er [place], [place], [address], [place], [place], [place], [address], [place] Department- [phone] [place] [facility] [phone]

## 2018-02-26 NOTE — ED Notes (Signed)
Bed: [facility] Expected date:  Expected time:  Means of arrival:  Comments: EMS 52yo kidney stones

## 2018-02-26 NOTE — ED Triage Notes (Signed)
Pt arriving POV with left sided flank pain. Pt states he believes this is a kidney stone due to past history.

## 2018-02-27 ENCOUNTER — Observation Stay (HOSPITAL_COMMUNITY): Payer: 59 | Admitting: Certified Registered Nurse Anesthetist

## 2018-02-27 ENCOUNTER — Encounter (HOSPITAL_COMMUNITY)
Admission: EM | Disposition: A | Discharge status: Home / Self Care | Payer: Self-pay | Source: Home / Self Care | Attending: Emergency Medicine

## 2018-02-27 ENCOUNTER — Encounter (HOSPITAL_COMMUNITY): Payer: Self-pay

## 2018-02-27 ENCOUNTER — Observation Stay (HOSPITAL_COMMUNITY): Payer: 59

## 2018-02-27 DIAGNOSIS — N202 Calculus of kidney with calculus of ureter: Secondary | ICD-10-CM | POA: Diagnosis not present

## 2018-02-27 DIAGNOSIS — N132 Hydronephrosis with renal and ureteral calculous obstruction: Secondary | ICD-10-CM

## 2018-02-27 DIAGNOSIS — N179 Acute kidney failure, unspecified: Secondary | ICD-10-CM | POA: Diagnosis not present

## 2018-02-27 DIAGNOSIS — N201 Calculus of ureter: Secondary | ICD-10-CM | POA: Diagnosis present

## 2018-02-27 DIAGNOSIS — N401 Enlarged prostate with lower urinary tract symptoms: Secondary | ICD-10-CM | POA: Diagnosis not present

## 2018-02-27 DIAGNOSIS — N200 Calculus of kidney: Secondary | ICD-10-CM | POA: Diagnosis not present

## 2018-02-27 HISTORY — PX: CYSTOSCOPY/URETEROSCOPY/HOLMIUM LASER: SHX6545

## 2018-02-27 LAB — CBC
HCT: 41.2 % (ref 39.0–52.0)
Hemoglobin: 14.3 g/dL (ref 13.0–17.0)
MCH: 29.4 pg (ref 26.0–34.0)
MCHC: 34.7 g/dL (ref 30.0–36.0)
MCV: 84.8 fL (ref 78.0–100.0)
PLATELETS: 217 10*3/uL (ref 150–400)
RBC: 4.86 MIL/uL (ref 4.22–5.81)
RDW: 12.3 % (ref 11.5–15.5)
WBC: 9 10*3/uL (ref 4.0–10.5)

## 2018-02-27 LAB — COMPREHENSIVE METABOLIC PANEL
ALBUMIN: 3.6 g/dL (ref 3.5–5.0)
ALT: 25 U/L (ref 0–44)
AST: 21 U/L (ref 15–41)
Alkaline Phosphatase: 50 U/L (ref 38–126)
Anion gap: 8 (ref 5–15)
BILIRUBIN TOTAL: 0.7 mg/dL (ref 0.3–1.2)
BUN: 19 mg/dL (ref 6–20)
CO2: 26 mmol/L (ref 22–32)
CREATININE: 1.88 mg/dL — AB (ref 0.61–1.24)
Calcium: 8.7 mg/dL — ABNORMAL LOW (ref 8.9–10.3)
Chloride: 106 mmol/L (ref 98–111)
GFR, EST AFRICAN AMERICAN: 46 mL/min — AB (ref >60)
GFR, EST NON AFRICAN AMERICAN: 40 mL/min — AB (ref >60)
Glucose, Bld: 125 mg/dL — ABNORMAL HIGH (ref 70–99)
Potassium: 4.5 mmol/L (ref 3.5–5.1)
SODIUM: 140 mmol/L (ref 135–145)
TOTAL PROTEIN: 6.2 g/dL — AB (ref 6.5–8.1)

## 2018-02-27 LAB — URINE CULTURE: Culture: NO GROWTH

## 2018-02-27 LAB — PHOSPHORUS: Phosphorus: 4 mg/dL (ref 2.5–4.6)

## 2018-02-27 LAB — TSH: TSH: 1.198 u[IU]/mL (ref 0.350–4.500)

## 2018-02-27 LAB — MAGNESIUM: Magnesium: 2.1 mg/dL (ref 1.7–2.4)

## 2018-02-27 LAB — HIV ANTIBODY (ROUTINE TESTING W REFLEX): HIV SCREEN 4TH GENERATION: NONREACTIVE

## 2018-02-27 SURGERY — CYSTOURETEROSCOPY, USING HOLMIUM LASER
Anesthesia: General | Site: Ureter | Laterality: Left

## 2018-02-27 MED ORDER — ONDANSETRON HCL 4 MG/2ML IJ SOLN
4.0000 mg | Freq: Four times a day (QID) | INTRAMUSCULAR | Status: DC | PRN
  Administered 2018-02-27: 4 mg via INTRAVENOUS
  Filled 2018-02-27: qty 2

## 2018-02-27 MED ORDER — FENTANYL CITRATE (PF) 100 MCG/2ML IJ SOLN
INTRAMUSCULAR | Status: AC
  Filled 2018-02-27: qty 2

## 2018-02-27 MED ORDER — CEFAZOLIN SODIUM-DEXTROSE 2-4 GM/100ML-% IV SOLN
2.0000 g | Freq: Once | INTRAVENOUS | Status: AC
  Administered 2018-02-27: 2 g via INTRAVENOUS

## 2018-02-27 MED ORDER — SENNA 8.6 MG PO TABS
1.0000 | ORAL_TABLET | Freq: Two times a day (BID) | ORAL | Status: DC
  Administered 2018-02-27 – 2018-02-28 (×4): 8.6 mg via ORAL
  Filled 2018-02-27 (×4): qty 1

## 2018-02-27 MED ORDER — OXYCODONE-ACETAMINOPHEN 5-325 MG PO TABS
1.0000 | ORAL_TABLET | ORAL | Status: DC | PRN
  Administered 2018-02-27 – 2018-02-28 (×3): 1 via ORAL
  Filled 2018-02-27 (×3): qty 1

## 2018-02-27 MED ORDER — HYDROCODONE-ACETAMINOPHEN 5-325 MG PO TABS: 1.0000 | ORAL_TABLET | ORAL | Status: DC | PRN

## 2018-02-27 MED ORDER — TAMSULOSIN HCL 0.4 MG PO CAPS
0.4000 mg | ORAL_CAPSULE | Freq: Every day | ORAL | Status: DC
  Administered 2018-02-27 – 2018-02-28 (×2): 0.4 mg via ORAL
  Filled 2018-02-27 (×2): qty 1

## 2018-02-27 MED ORDER — DEXAMETHASONE SODIUM PHOSPHATE 4 MG/ML IJ SOLN
INTRAMUSCULAR | Status: DC | PRN
  Administered 2018-02-27: 10 mg via INTRAVENOUS

## 2018-02-27 MED ORDER — HYDROMORPHONE HCL 1 MG/ML IJ SOLN: 0.2500 mg | INTRAMUSCULAR | Status: DC | PRN

## 2018-02-27 MED ORDER — LACTATED RINGERS IV SOLN
INTRAVENOUS | Status: DC
  Administered 2018-02-27 – 2018-02-28 (×3): via INTRAVENOUS

## 2018-02-27 MED ORDER — SODIUM CHLORIDE 0.9 % IV SOLN
INTRAVENOUS | Status: AC
  Administered 2018-02-27: via INTRAVENOUS

## 2018-02-27 MED ORDER — ACETAMINOPHEN 650 MG RE SUPP: 650.0000 mg | Freq: Four times a day (QID) | RECTAL | Status: DC | PRN

## 2018-02-27 MED ORDER — SODIUM CHLORIDE 0.9 % IR SOLN
Status: DC | PRN
  Administered 2018-02-27: 3000 mL

## 2018-02-27 MED ORDER — ACETAMINOPHEN 325 MG PO TABS: 650.0000 mg | ORAL_TABLET | Freq: Four times a day (QID) | ORAL | Status: DC | PRN

## 2018-02-27 MED ORDER — HYDROCODONE-ACETAMINOPHEN 7.5-325 MG PO TABS: 1.0000 | ORAL_TABLET | Freq: Once | ORAL | Status: DC | PRN

## 2018-02-27 MED ORDER — IOHEXOL 300 MG/ML  SOLN
INTRAMUSCULAR | Status: DC | PRN
  Administered 2018-02-27: 10 mL

## 2018-02-27 MED ORDER — MIDAZOLAM HCL 2 MG/2ML IJ SOLN
INTRAMUSCULAR | Status: AC
  Filled 2018-02-27: qty 2

## 2018-02-27 MED ORDER — FENTANYL CITRATE (PF) 100 MCG/2ML IJ SOLN
INTRAMUSCULAR | Status: DC | PRN
  Administered 2018-02-27: 50 ug via INTRAVENOUS
  Administered 2018-02-27 (×4): 25 ug via INTRAVENOUS
  Administered 2018-02-27: 50 ug via INTRAVENOUS

## 2018-02-27 MED ORDER — ONDANSETRON HCL 4 MG/2ML IJ SOLN: 4.0000 mg | Freq: Once | INTRAMUSCULAR | Status: DC | PRN

## 2018-02-27 MED ORDER — PROPOFOL 10 MG/ML IV BOLUS
INTRAVENOUS | Status: AC
  Filled 2018-02-27: qty 20

## 2018-02-27 MED ORDER — MEPERIDINE HCL 50 MG/ML IJ SOLN
INTRAMUSCULAR | Status: AC
  Filled 2018-02-27: qty 1

## 2018-02-27 MED ORDER — MEPERIDINE HCL 50 MG/ML IJ SOLN
6.2500 mg | INTRAMUSCULAR | Status: DC | PRN
  Administered 2018-02-27: 12.5 mg via INTRAVENOUS

## 2018-02-27 MED ORDER — PROPOFOL 10 MG/ML IV BOLUS
INTRAVENOUS | Status: DC | PRN
  Administered 2018-02-27: 50 mg via INTRAVENOUS

## 2018-02-27 MED ORDER — CEFAZOLIN SODIUM-DEXTROSE 2-4 GM/100ML-% IV SOLN
INTRAVENOUS | Status: AC
  Filled 2018-02-27: qty 100

## 2018-02-27 MED ORDER — MIDAZOLAM HCL 2 MG/2ML IJ SOLN
INTRAMUSCULAR | Status: DC | PRN
  Administered 2018-02-27: 2 mg via INTRAVENOUS

## 2018-02-27 MED ORDER — ONDANSETRON HCL 4 MG PO TABS: 4.0000 mg | ORAL_TABLET | Freq: Four times a day (QID) | ORAL | Status: DC | PRN

## 2018-02-27 MED ORDER — POLYETHYLENE GLYCOL 3350 17 G PO PACK: 17.0000 g | PACK | Freq: Every day | ORAL | Status: DC | PRN

## 2018-02-27 SURGICAL SUPPLY — 28 items
BAG URO CATCHER STRL LF (MISCELLANEOUS) ×2 IMPLANT
BASKET DAKOTA 1.9FR 11X120 (BASKET) ×1 IMPLANT
BASKET LASER NITINOL 1.9FR (BASKET) IMPLANT
BASKET ZERO TIP NITINOL 2.4FR (BASKET) IMPLANT
BSKT STON RTRVL 120 1.9FR (BASKET)
BSKT STON RTRVL ZERO TP 2.4FR (BASKET)
CATH INTERMIT  6FR 70CM (CATHETERS) ×1 IMPLANT
CATH URET 5FR 28IN CONE TIP (BALLOONS)
CATH URET 5FR 70CM CONE TIP (BALLOONS) IMPLANT
CLOTH BEACON ORANGE TIMEOUT ST (SAFETY) ×2 IMPLANT
EXTRACTOR STONE 1.7FRX115CM (UROLOGICAL SUPPLIES) IMPLANT
FIBER LASER FLEXIVA 365 (UROLOGICAL SUPPLIES) IMPLANT
FIBER LASER TRAC TIP (UROLOGICAL SUPPLIES) ×1 IMPLANT
GLOVE BIO SURGEON STRL SZ7.5 (GLOVE) ×2 IMPLANT
GLOVE BIOGEL M STRL SZ7.5 (GLOVE) ×1 IMPLANT
GLOVE BIOGEL PI IND STRL 7.0 (GLOVE) IMPLANT
GLOVE BIOGEL PI INDICATOR 7.0 (GLOVE) ×1
GOWN SPEC L4 XLG W/TWL (GOWN DISPOSABLE) ×1 IMPLANT
GOWN STRL REUS W/TWL XL LVL3 (GOWN DISPOSABLE) ×2 IMPLANT
GUIDEWIRE ANG ZIPWIRE 038X150 (WIRE) IMPLANT
GUIDEWIRE STR DUAL SENSOR (WIRE) ×3 IMPLANT
INFUSOR MANOMETER BAG 3000ML (MISCELLANEOUS) IMPLANT
MANIFOLD NEPTUNE II (INSTRUMENTS) ×2 IMPLANT
PACK CYSTO (CUSTOM PROCEDURE TRAY) ×2 IMPLANT
SHEATH URETERAL 12FRX28CM (UROLOGICAL SUPPLIES) IMPLANT
SHEATH URETERAL 12FRX35CM (MISCELLANEOUS) ×1 IMPLANT
STENT URET 6FRX26 CONTOUR (STENTS) ×1 IMPLANT
TUBING UROLOGY SET (TUBING) ×2 IMPLANT

## 2018-02-27 NOTE — Transfer of Care (Signed)
Immediate Anesthesia Transfer of Care Note  Patient: Mike Pena  Procedure(s) Performed: Procedure(s): CYSTOSCOPY/URETEROSCOPY/HOLMIUM LASER, STENT PLACEMENT (Left)  Patient Location: PACU  Anesthesia Type:General  Level of Consciousness:  sedated, patient cooperative and responds to stimulation  Airway & Oxygen Therapy:Patient Spontanous Breathing and Patient connected to face mask oxgen  Post-op Assessment:  Report given to PACU RN and Post -op Vital signs reviewed and stable  Post vital signs:  Reviewed and stable  Last Vitals:  Vitals:   02/27/18 1737 02/27/18 1951  BP: 132/76   Pulse: (!) 59   Resp: 16   Temp: 37.1 C (P) 36.9 C  SpO2: 46%     Complications: No apparent anesthesia complications

## 2018-02-27 NOTE — Anesthesia Procedure Notes (Signed)
Procedure Name: Intubation Date/Time: 02/27/2018 6:34 PM Performed by: Anne Fu, CRNA Pre-anesthesia Checklist: Patient identified, Emergency Drugs available, Suction available, Patient being monitored and Timeout performed Patient Re-evaluated:Patient Re-evaluated prior to induction Oxygen Delivery Method: Circle system utilized Preoxygenation: Pre-oxygenation with 100% oxygen Induction Type: IV induction Ventilation: Mask ventilation without difficulty Laryngoscope Size: Mac and 4 Tube type: Oral Tube size: 7.5 mm Number of attempts: 1 Airway Equipment and Method: Stylet Placement Confirmation: ETT inserted through vocal cords under direct vision,  positive ETCO2 and breath sounds checked- equal and bilateral Tube secured with: Tape Dental Injury: Teeth and Oropharynx as per pre-operative assessment

## 2018-02-27 NOTE — Progress Notes (Signed)
PROGRESS NOTE    [name]  DJM:[id number] DOB: [birth year] DOA: 02/26/2018 PCP: Orpah Melter, MD   Brief Narrative: Mike Pena is a 52 y.o. male with medical history significant of kidney stones in the past requiring lithotripsy x2. He presented secondary to left flank pain and was found to have an obstructing left kidney stone causing hydroureteronephrosis with no evidence of infection. Urology consulted.   Assessment & Plan:   Active Problems:   AKI (acute kidney injury) ([place])   Kidney stone   Leukocytosis   Ureterolithiasis   Left obstructive kidney stone Left hydroureteronephrosis Patient with a prior history of renal stones. No history of stents. Pain is still persistent this morning. Worsening AKI. Urinalysis does not suggest infection -Urology consult -Continue pain management -NPO  Enlarged prostate Seen on CT scan -Outpatient follow-up  Acute kidney injury Baseline creatinine of 1.2 from 2016. Unknown recent baseline. Creatinine up from 1.5 on admission to 1.88 today. Likely secondary to hydronephrosis -Continue IV fluids -Urology management of above   DVT prophylaxis: SCDs Code Status:   Code Status: Full Code Family Communication: None Disposition Plan: Discharge pending urology workup   Consultants:   Urology  Procedures:   None  Antimicrobials:  None    Subjective: Pain today. No other concerns.  Objective: Vitals:   02/26/18 1930 02/26/18 2346 02/27/18 0009 02/27/18 0604  BP: 133/70 127/74 (!) 143/71 123/73  Pulse: 80 65 60 (!) 52  Resp: 18 18 20 17   Temp:   98 F (36.7 C) 98.1 F (36.7 C)  TempSrc:   Oral   SpO2: 92% 94% 94% 94%  Weight:   96.6 kg   Height:   5' 10.5" (1.791 m)     Intake/Output Summary (Last 24 hours) at 02/27/2018 1350 Last data filed at 02/27/2018 0600 Gross per 24 hour  Intake 1212.69 ml  Output -  Net 1212.69 ml   Filed Weights   02/27/18 0009  Weight: 96.6 kg    Examination:  General  exam: Appears calm and comfortable Respiratory system: Clear to auscultation. Respiratory effort normal. Cardiovascular system: S1 & S2 heard, RRR. No murmurs, rubs, gallops or clicks. Gastrointestinal system: Abdomen is nondistended, soft and nontender. No organomegaly or masses felt. Normal bowel sounds heard. Central nervous system: Alert and oriented. No focal neurological deficits. Extremities: No edema. No calf tenderness Skin: No cyanosis. No rashes Psychiatry: Judgement and insight appear normal. Mood & affect appropriate.     Data Reviewed: I have personally reviewed following labs and imaging studies  CBC: Recent Labs  Lab 02/26/18 0512 02/26/18 1949 02/27/18 0510  WBC  --  12.9* 9.0  NEUTROABS  --  11.4*  --   HGB 15.6 15.0 14.3  HCT 46.0 43.2 41.2  MCV  --  84.4 84.8  PLT  --  234 222   Basic Metabolic Panel: Recent Labs  Lab 02/26/18 0512 02/26/18 1949 02/27/18 0510  NA 138 142 140  K 4.3 4.7 4.5  CL 103 106 106  CO2  --  24 26  GLUCOSE 141* 146* 125*  BUN 14 18 19   CREATININE 1.50* 1.66* 1.88*  CALCIUM  --  9.1 8.7*  MG  --   --  2.1  PHOS  --   --  4.0   GFR: Estimated Creatinine Clearance: 54.7 mL/min (A) (by C-G formula based on SCr of 1.88 mg/dL (H)). Liver Function Tests: Recent Labs  Lab 02/26/18 1949 02/27/18 0510  AST 30 21  ALT  27 25  ALKPHOS 50 50  BILITOT 1.1 0.7  PROT 6.7 6.2*  ALBUMIN 4.2 3.6   No results for input(s): LIPASE, AMYLASE in the last 168 hours. No results for input(s): AMMONIA in the last 168 hours. Coagulation Profile: No results for input(s): INR, PROTIME in the last 168 hours. Cardiac Enzymes: No results for input(s): CKTOTAL, CKMB, CKMBINDEX, TROPONINI in the last 168 hours. BNP (last 3 results) No results for input(s): PROBNP in the last 8760 hours. HbA1C: No results for input(s): HGBA1C in the last 72 hours. CBG: No results for input(s): GLUCAP in the last 168 hours. Lipid Profile: No results for  input(s): CHOL, HDL, LDLCALC, TRIG, CHOLHDL, LDLDIRECT in the last 72 hours. Thyroid Function Tests: Recent Labs    02/27/18 0510  TSH 1.198   Anemia Panel: No results for input(s): VITAMINB12, FOLATE, FERRITIN, TIBC, IRON, RETICCTPCT in the last 72 hours. Sepsis Labs: No results for input(s): PROCALCITON, LATICACIDVEN in the last 168 hours.  Recent Results (from the past 240 hour(s))  Urine culture     Status: None   Collection Time: 02/26/18  5:04 AM  Result Value Ref Range Status   Specimen Description   Final    URINE, CLEAN CATCH Performed at [facility], [place] [address], [place], [place] [address]    Special Requests   Final    NONE Performed at [facility], [place] [address], [place], [place] [address]    Culture   Final    NO GROWTH Performed at [facility], [place] [address], [place], [place] [address]    Report Status 02/27/2018 FINAL  Final         Radiology Studies: Ct Renal Stone Study  Result Date: 02/26/2018 CLINICAL DATA:  Left flank pain EXAM: CT ABDOMEN AND PELVIS WITHOUT CONTRAST TECHNIQUE: Multidetector CT imaging of the abdomen and pelvis was performed following the standard protocol without IV contrast. COMPARISON:  CT abdomen pelvis 07/28/2014 FINDINGS: LOWER CHEST: There is no basilar pleural or apical pericardial effusion. HEPATOBILIARY: The hepatic contours and density are normal. There is no intra- or extrahepatic biliary dilatation. The gallbladder is normal. PANCREAS: The pancreatic parenchymal contours are normal and there is no ductal dilatation. There is no peripancreatic fluid collection. SPLEEN: Normal. ADRENALS/URINARY TRACT: --Adrenal glands: Normal. --Right kidney/ureter: No hydronephrosis, nephroureterolithiasis, perinephric stranding or solid renal mass. --Left kidney/ureter: There is a 6 x 6 mm obstructing stone in the proximal left ureter, causing moderate hydroureteronephrosis and perinephric edema.  There is an additional, nonobstructive renal calculus that measures 6 mm. --Urinary bladder: Normal for degree of distention STOMACH/BOWEL: --Stomach/Duodenum: There is no hiatal hernia or other gastric abnormality. The duodenal course and caliber are normal. --Small bowel: No dilatation or inflammation. --Colon: No focal abnormality. --Appendix: Normal. VASCULAR/LYMPHATIC: Normal course and caliber of the major abdominal vessels. No abdominal or pelvic lymphadenopathy. REPRODUCTIVE: Enlarged prostate measures 6.3 cm in transverse dimension. MUSCULOSKELETAL. No bony spinal canal stenosis or focal osseous abnormality. OTHER: None. IMPRESSION: 1. Left obstructive uropathy with 6 x 6 mm proximal ureteral stone causing moderate hydroureteronephrosis and perinephric edema. 2. Nonobstructive left renal calculus measuring 6 mm. 3. Enlarged prostate, measuring 6.3 cm. Electronically Signed   By: Ulyses Jarred M.D.   On: 02/26/2018 06:04        Scheduled Meds: . senna  1 tablet Oral BID  . tamsulosin  0.4 mg Oral Daily   Continuous Infusions:   LOS: 0 days     [place]  Lonny Prude, MD [facility] 02/27/2018, 1:50 PM Pager: [phone]  If 7PM-7AM, please contact night-coverage [email or web address] 02/27/2018, 1:50 PM

## 2018-02-27 NOTE — Anesthesia Postprocedure Evaluation (Signed)
Anesthesia Post Note  Patient: Mike Pena  Procedure(s) Performed: CYSTOSCOPY/URETEROSCOPY/HOLMIUM LASER, STENT PLACEMENT (Left Ureter)     Patient location during evaluation: PACU Anesthesia Type: General Level of consciousness: awake and alert and oriented Pain management: pain level controlled Vital Signs Assessment: post-procedure vital signs reviewed and stable Respiratory status: spontaneous breathing, nonlabored ventilation and respiratory function stable Cardiovascular status: blood pressure returned to baseline and stable Postop Assessment: no apparent nausea or vomiting Anesthetic complications: no    Last Vitals:  Vitals:   02/27/18 2000 02/27/18 2015  BP: 131/85 131/80  Pulse: 78 69  Resp: 13 14  Temp:    SpO2: 96% 94%    Last Pain:  Vitals:   02/27/18 2015  TempSrc:   PainSc: 0-No pain                 Trebor Galdamez A.

## 2018-02-27 NOTE — Anesthesia Preprocedure Evaluation (Signed)
Anesthesia Evaluation  Patient identified by MRN, date of birth, ID band Patient awake    Reviewed: Allergy & Precautions, NPO status , Patient's Chart, lab work & pertinent test results  Airway Mallampati: II  TM Distance: >3 FB Neck ROM: Full    Dental no notable dental hx. (+) Teeth Intact   Pulmonary neg pulmonary ROS,    Pulmonary exam normal breath sounds clear to auscultation       Cardiovascular negative cardio ROS Normal cardiovascular exam Rhythm:Regular Rate:Normal     Neuro/Psych negative neurological ROS  negative psych ROS   GI/Hepatic negative GI ROS, Neg liver ROS,   Endo/Other  Obesity  Renal/GU Renal InsufficiencyRenal diseaseLeft ureteral calculus  negative genitourinary   Musculoskeletal negative musculoskeletal ROS (+)   Abdominal (+) + obese,   Peds  Hematology negative hematology ROS (+)   Anesthesia Other Findings   Reproductive/Obstetrics                             Anesthesia Physical Anesthesia Plan  ASA: II  Anesthesia Plan: General   Post-op Pain Management:    Induction: Intravenous  PONV Risk Score and Plan: 4 or greater and Midazolam, Ondansetron, Dexamethasone and Treatment may vary due to age or medical condition  Airway Management Planned: LMA  Additional Equipment:   Intra-op Plan:   Post-operative Plan: Extubation in OR  Informed Consent: I have reviewed the patients History and Physical, chart, labs and discussed the procedure including the risks, benefits and alternatives for the proposed anesthesia with the patient or authorized representative who has indicated his/her understanding and acceptance.   Dental advisory given  Plan Discussed with: CRNA and Surgeon  Anesthesia Plan Comments:         Anesthesia Quick Evaluation

## 2018-02-27 NOTE — Op Note (Signed)
Operative Note  Preoperative diagnosis:  1.  Left renal and ureteral calculus  Postoperative diagnosis: 1.  Left renal and ureteral calculus  Procedure(s): 1.  Cystoscopy with left retrograde pyelogram, left ureteroscopy with laser lithotripsy and stone extraction, left ureteral stent placement  Surgeon: Link Snuffer, MD  Assistants: None  Anesthesia: General  Complications: None immediate  EBL: Minimal  Specimens: 1.  None  Drains/Catheters: 1.  6 x 26 double-J ureteral stent  Intraoperative findings: 1.  Normal urethra and bladder 2.  Left retrograde pyelogram revealed a filling defect at the level of the stone.  The stone was pushed back into the kidney.  There is minimal hydronephrosis. 3.  Two 5 to 6 mm renal calculi fragmented to tiny fragments and extracted  Indication: 52 year old male with a left ureteral calculus and acute renal insufficiency presents for the previously mentioned operation.  Description of procedure:  The patient was identified and consent was obtained.  The patient was taken to the operating room and placed in the supine position.  The patient was placed under general anesthesia.  Perioperative antibiotics were administered.  The patient was placed in dorsal lithotomy.  Patient was prepped and draped in a standard sterile fashion and a timeout was performed.  A 21 French rigid cystoscope was advanced into the urethra and into the bladder.  Complete cystoscopy was performed with no abnormal findings.  The left ureter was cannulated with a sensor wire which was advanced up to the kidney under fluoroscopic guidance.  A semirigid ureteroscope was advanced alongside the wire up to the renal pelvis and no ureteral calculi were seen.  Retrograde pyelogram was performed through the scope with the findings noted above.  A second sensor wire was advanced through the scope and into the kidney and the scope was withdrawn.  A 12 x 14 ureteral access sheath was  advanced over the wire under continuous fluoroscopic guidance up the ureter.  The inner sheath and the wire were withdrawn.  The safety wire was left in place.  Flexible ureteroscopy was performed which identified the 2 stones of interest which were fragmented to smaller fragments with the laser.  Larger fragments were basket extracted.  I performed a complete pyeloscopy and no other significant stone fragments were seen.  I then withdrew the scope along with the access sheath.  No significant trauma to the ureter was noted and no ureteral calculi were seen.  I then backloaded the wire onto a rigid cystoscope which was advanced into the bladder.  A 6 x 26 double-J ureteral stent was placed in a standard fashion followed by removal of the wire.  Fluoroscopy confirmed proximal placement and direct visualization confirmed a good coil within the bladder.  The bladder was drained and the scope withdrawn.  The patient tolerated procedure well and was stable postoperatively.  Plan: Can likely be discharged tomorrow.  Can discharge with pain medication, Flomax, oxybutynin.  I will see him back in 1 week for ureteral stent removal.

## 2018-02-27 NOTE — Consult Note (Signed)
H&P Physician requesting consult: Cordelia Poche, MD  Chief Complaint: Left ureteral calculus  History of Present Illness: 52 year old male with a history of renal calculi.  He has had ESWL x2.  He presented last night with severe left-sided flank pain.  CT scan revealed a 5 mm left proximal ureteral calculus.  There was also a left nonobstructing lower pole renal calculus.  Patient was admitted for observation.  He had mild AKI upon admission with a creatinine of 1.5.  However, despite conservative management, creatinine continues to worsen.  Most recent value was 1.88.  No leukocytosis. Urinalysis is not consistent with infection.  There were trace leukocytes negative nitrite no bacteria, 6-10 WBCs, greater than 50 RBCs.  The patient's pain has improved but as I said before, creatinine continues to worsen.   Past Medical History:  Diagnosis Date  . kidney stones    Past Surgical History:  Procedure Laterality Date  . EYE SURGERY     cataract surgery    Home Medications:  Medications Prior to Admission  Medication Sig Dispense Refill Last Dose  . HYDROcodone-acetaminophen (NORCO/VICODIN) 5-325 MG tablet Take 1 tablet by mouth every 6 (six) hours as needed for moderate pain.   Past Week at Unknown time  . KRILL OIL PO Take 1 capsule by mouth daily.   02/25/2018 at Unknown time  . oxycodone (OXY-IR) 5 MG capsule Take 5 mg by mouth every 4 (four) hours as needed for pain.   02/26/2018 at Unknown time  . tamsulosin (FLOMAX) 0.4 MG CAPS capsule Take 1 capsule (0.4 mg total) by mouth daily. 30 capsule 0 02/26/2018 at Unknown time  . cephALEXin (KEFLEX) 500 MG capsule Take 1 capsule (500 mg total) by mouth 2 (two) times daily. 20 capsule 0   . ondansetron (ZOFRAN ODT) 4 MG disintegrating tablet Take 1 tablet (4 mg total) by mouth every 8 (eight) hours as needed for nausea or vomiting. 10 tablet 0   . oxyCODONE-acetaminophen (PERCOCET/ROXICET) 5-325 MG tablet Take 1 tablet by mouth every 4 (four) hours  as needed for severe pain. 12 tablet 0    Allergies: No Known Allergies  Family History  Problem Relation Age of Onset  . Cancer Neg Hx   . Diabetes Neg Hx    Social History:  reports that he has never smoked. He has never used smokeless tobacco. He reports that he drank alcohol. His drug history is not on file.  ROS: A complete review of systems was performed.  All systems are negative except for pertinent findings as noted. ROS   Physical Exam:  Vital signs in last 24 hours: Temp:  [98 F (36.7 C)-98.1 F (36.7 C)] 98.1 F (36.7 C) (09/10 0604) Pulse Rate:  [52-80] 63 (09/10 1400) Resp:  [17-20] 18 (09/10 1400) BP: (123-143)/(70-74) 134/73 (09/10 1400) SpO2:  [92 %-96 %] 94 % (09/10 1400) Weight:  [96.6 kg] 96.6 kg (09/10 0009) General:  Alert and oriented, No acute distress HEENT: Normocephalic, atraumatic Neck: No JVD or lymphadenopathy Cardiovascular: Regular rate and rhythm Lungs: Regular rate and effort Abdomen: Soft, nontender, nondistended, no abdominal masses Back: No CVA tenderness Extremities: No edema Neurologic: Grossly intact  Laboratory Data:  Results for orders placed or performed during the hospital encounter of 02/26/18 (from the past 24 hour(s))  Comprehensive metabolic panel     Status: Abnormal   Collection Time: 02/26/18  7:49 PM  Result Value Ref Range   Sodium 142 135 - 145 mmol/L   Potassium 4.7 3.5 -  5.1 mmol/L   Chloride 106 98 - 111 mmol/L   CO2 24 22 - 32 mmol/L   Glucose, Bld 146 (H) 70 - 99 mg/dL   BUN 18 6 - 20 mg/dL   Creatinine, Ser 1.66 (H) 0.61 - 1.24 mg/dL   Calcium 9.1 8.9 - 10.3 mg/dL   Total Protein 6.7 6.5 - 8.1 g/dL   Albumin 4.2 3.5 - 5.0 g/dL   AST 30 15 - 41 U/L   ALT 27 0 - 44 U/L   Alkaline Phosphatase 50 38 - 126 U/L   Total Bilirubin 1.1 0.3 - 1.2 mg/dL   GFR calc non Af Amer 46 (L) >60 mL/min   GFR calc Af Amer 54 (L) >60 mL/min   Anion gap 12 5 - 15  CBC with Differential     Status: Abnormal   Collection  Time: 02/26/18  7:49 PM  Result Value Ref Range   WBC 12.9 (H) 4.0 - 10.5 K/uL   RBC 5.12 4.22 - 5.81 MIL/uL   Hemoglobin 15.0 13.0 - 17.0 g/dL   HCT 43.2 39.0 - 52.0 %   MCV 84.4 78.0 - 100.0 fL   MCH 29.3 26.0 - 34.0 pg   MCHC 34.7 30.0 - 36.0 g/dL   RDW 12.3 11.5 - 15.5 %   Platelets 234 150 - 400 K/uL   Neutrophils Relative % 89 %   Neutro Abs 11.4 (H) 1.7 - 7.7 K/uL   Lymphocytes Relative 8 %   Lymphs Abs 1.0 0.7 - 4.0 K/uL   Monocytes Relative 3 %   Monocytes Absolute 0.4 0.1 - 1.0 K/uL   Eosinophils Relative 0 %   Eosinophils Absolute 0.0 0.0 - 0.7 K/uL   Basophils Relative 0 %   Basophils Absolute 0.0 0.0 - 0.1 K/uL  Magnesium     Status: None   Collection Time: 02/27/18  5:10 AM  Result Value Ref Range   Magnesium 2.1 1.7 - 2.4 mg/dL  Phosphorus     Status: None   Collection Time: 02/27/18  5:10 AM  Result Value Ref Range   Phosphorus 4.0 2.5 - 4.6 mg/dL  TSH     Status: None   Collection Time: 02/27/18  5:10 AM  Result Value Ref Range   TSH 1.198 0.350 - 4.500 uIU/mL  Comprehensive metabolic panel     Status: Abnormal   Collection Time: 02/27/18  5:10 AM  Result Value Ref Range   Sodium 140 135 - 145 mmol/L   Potassium 4.5 3.5 - 5.1 mmol/L   Chloride 106 98 - 111 mmol/L   CO2 26 22 - 32 mmol/L   Glucose, Bld 125 (H) 70 - 99 mg/dL   BUN 19 6 - 20 mg/dL   Creatinine, Ser 1.88 (H) 0.61 - 1.24 mg/dL   Calcium 8.7 (L) 8.9 - 10.3 mg/dL   Total Protein 6.2 (L) 6.5 - 8.1 g/dL   Albumin 3.6 3.5 - 5.0 g/dL   AST 21 15 - 41 U/L   ALT 25 0 - 44 U/L   Alkaline Phosphatase 50 38 - 126 U/L   Total Bilirubin 0.7 0.3 - 1.2 mg/dL   GFR calc non Af Amer 40 (L) >60 mL/min   GFR calc Af Amer 46 (L) >60 mL/min   Anion gap 8 5 - 15  CBC     Status: None   Collection Time: 02/27/18  5:10 AM  Result Value Ref Range   WBC 9.0 4.0 - 10.5 K/uL   RBC  4.86 4.22 - 5.81 MIL/uL   Hemoglobin 14.3 13.0 - 17.0 g/dL   HCT 41.2 39.0 - 52.0 %   MCV 84.8 78.0 - 100.0 fL   MCH 29.4 26.0  - 34.0 pg   MCHC 34.7 30.0 - 36.0 g/dL   RDW 12.3 11.5 - 15.5 %   Platelets 217 150 - 400 K/uL  HIV antibody (Routine Testing)     Status: None   Collection Time: 02/27/18  5:10 AM  Result Value Ref Range   HIV Screen 4th Generation wRfx Non Reactive Non Reactive   Recent Results (from the past 240 hour(s))  Urine culture     Status: None   Collection Time: 02/26/18  5:04 AM  Result Value Ref Range Status   Specimen Description   Final    URINE, CLEAN CATCH Performed at [facility], [place] [address], [place], [place] [address]    Special Requests   Final    NONE Performed at [facility], [place] [address] [place], [place] [address]    Culture   Final    NO GROWTH Performed at [facility], [place] [address] [place], [place] [address]    Report Status 02/27/2018 FINAL  Final   Creatinine: Recent Labs    02/26/18 0512 02/26/18 1949 02/27/18 0510  CREATININE 1.50* 1.66* 1.88*   CT scan personally reviewed and is described in the history of present illness.  Impression/Assessment:  Left renal and ureteral calculus Hydronephrosis with ureteral obstruction secondary to the above Left flank pain secondary to the above Acute renal insufficiency  Plan:  Proceed with cystoscopy with left ureteroscopy, laser lithotripsy, ureteral stent placement.  He understands potential risks including but not limited to bleeding, infection, injury to surrounding structures including potential for ureteral avulsion, need for additional procedures.  He wishes to proceed  Marton Redwood, III 02/27/2018, 5:32 PM

## 2018-02-28 ENCOUNTER — Encounter (HOSPITAL_COMMUNITY): Payer: Self-pay | Admitting: Urology

## 2018-02-28 DIAGNOSIS — N201 Calculus of ureter: Secondary | ICD-10-CM | POA: Diagnosis not present

## 2018-02-28 DIAGNOSIS — N200 Calculus of kidney: Secondary | ICD-10-CM | POA: Diagnosis not present

## 2018-02-28 DIAGNOSIS — N179 Acute kidney failure, unspecified: Secondary | ICD-10-CM | POA: Diagnosis not present

## 2018-02-28 DIAGNOSIS — D72828 Other elevated white blood cell count: Secondary | ICD-10-CM | POA: Diagnosis not present

## 2018-02-28 LAB — CBC
HCT: 39.3 % (ref 39.0–52.0)
HEMOGLOBIN: 13.7 g/dL (ref 13.0–17.0)
MCH: 29.5 pg (ref 26.0–34.0)
MCHC: 34.9 g/dL (ref 30.0–36.0)
MCV: 84.7 fL (ref 78.0–100.0)
Platelets: 207 10*3/uL (ref 150–400)
RBC: 4.64 MIL/uL (ref 4.22–5.81)
RDW: 12.3 % (ref 11.5–15.5)
WBC: 8.5 10*3/uL (ref 4.0–10.5)

## 2018-02-28 LAB — BASIC METABOLIC PANEL
ANION GAP: 9 (ref 5–15)
BUN: 18 mg/dL (ref 6–20)
CHLORIDE: 104 mmol/L (ref 98–111)
CO2: 27 mmol/L (ref 22–32)
Calcium: 8.8 mg/dL — ABNORMAL LOW (ref 8.9–10.3)
Creatinine, Ser: 1.71 mg/dL — ABNORMAL HIGH (ref 0.61–1.24)
GFR calc non Af Amer: 45 mL/min — ABNORMAL LOW (ref >60)
GFR, EST AFRICAN AMERICAN: 52 mL/min — AB (ref >60)
Glucose, Bld: 123 mg/dL — ABNORMAL HIGH (ref 70–99)
Potassium: 4.2 mmol/L (ref 3.5–5.1)
Sodium: 140 mmol/L (ref 135–145)

## 2018-02-28 NOTE — Discharge Summary (Signed)
Physician Discharge Summary  Ellen Mayol Sampey PPI:[id number] DOB: 10/17/1965 DOA: 02/26/2018  PCP: Orpah Melter, MD  Admit date: 02/26/2018 Discharge date: 02/28/2018  Admitted From: Home Disposition: Home Recommendations for Outpatient Follow-up:  1. Follow up with PCP in 1-2 weeks 2. Please obtain BMP/CBC in one week 3 follow-up with urology in 1 week  Home Health: None Equipment/Devices:none  Discharge Condition stable CODE STATUS full code Diet recommendation: Cardiac Brief/Interim Summary:52 y.o. malewith medical history significant ofkidney stones in the past requiring lithotripsy x2. He presented secondary to left flank pain and was found to have an obstructing left kidney stone causing hydroureteronephrosis with no evidence of infection. Urology consulted.  Discharge Diagnoses:  Active Problems:   AKI (acute kidney injury) ([place])   Kidney stone   Leukocytosis   Ureterolithiasis  1] left obstructive kidney stone with hydronephrosis status post cystoscopy ureteroscopy stent and laser treatment.  UA suggested no infection urine culture no growth.  Patient to follow-up with urology in 1 week for stent removal.  Patient has enlarged prostate on CT scan he will follow-up with urology .  2] AKI improved creatinine 1.71 upon discharge.  Follow-up BMP in 1 week.follow   Up with PCP.  Discharge Instructions  Discharge Instructions    Call MD for:  difficulty breathing, headache or visual disturbances   Complete by:  As directed    Call MD for:  extreme fatigue   Complete by:  As directed    Call MD for:  persistant dizziness or light-headedness   Complete by:  As directed    Call MD for:  persistant nausea and vomiting   Complete by:  As directed    Call MD for:  severe uncontrolled pain   Complete by:  As directed    Diet - low sodium heart healthy   Complete by:  As directed    Increase activity slowly   Complete by:  As directed      Allergies as of 02/28/2018   No  Known Allergies     Medication List    STOP taking these medications   cephALEXin 500 MG capsule Commonly known as:  KEFLEX   oxyCODONE-acetaminophen 5-325 MG tablet Commonly known as:  PERCOCET/ROXICET     TAKE these medications   HYDROcodone-acetaminophen 5-325 MG tablet Commonly known as:  NORCO/VICODIN Take 1 tablet by mouth every 6 (six) hours as needed for moderate pain.   KRILL OIL PO Take 1 capsule by mouth daily.   ondansetron 4 MG disintegrating tablet Commonly known as:  ZOFRAN-ODT Take 1 tablet (4 mg total) by mouth every 8 (eight) hours as needed for nausea or vomiting.   oxycodone 5 MG capsule Commonly known as:  OXY-IR Take 5 mg by mouth every 4 (four) hours as needed for pain.   tamsulosin 0.4 MG Caps capsule Commonly known as:  FLOMAX Take 1 capsule (0.4 mg total) by mouth daily.      Follow-up Information    Orpah Melter, MD Follow up.   Specialty:  Family Medicine Contact information: [place] [address] [phone]          No Known Allergies  Consultations: urology  Procedures/Studies: Dg C-arm 1-60 Min-no Report  Result Date: 02/27/2018 Fluoroscopy was utilized by the requesting physician.  No radiographic interpretation.   Ct Renal Stone Study  Result Date: 02/26/2018 CLINICAL DATA:  Left flank pain EXAM: CT ABDOMEN AND PELVIS WITHOUT CONTRAST TECHNIQUE: Multidetector CT imaging of the abdomen and  pelvis was performed following the standard protocol without IV contrast. COMPARISON:  CT abdomen pelvis 07/28/2014 FINDINGS: LOWER CHEST: There is no basilar pleural or apical pericardial effusion. HEPATOBILIARY: The hepatic contours and density are normal. There is no intra- or extrahepatic biliary dilatation. The gallbladder is normal. PANCREAS: The pancreatic parenchymal contours are normal and there is no ductal dilatation. There is no peripancreatic fluid collection. SPLEEN: Normal. ADRENALS/URINARY TRACT:  --Adrenal glands: Normal. --Right kidney/ureter: No hydronephrosis, nephroureterolithiasis, perinephric stranding or solid renal mass. --Left kidney/ureter: There is a 6 x 6 mm obstructing stone in the proximal left ureter, causing moderate hydroureteronephrosis and perinephric edema. There is an additional, nonobstructive renal calculus that measures 6 mm. --Urinary bladder: Normal for degree of distention STOMACH/BOWEL: --Stomach/Duodenum: There is no hiatal hernia or other gastric abnormality. The duodenal course and caliber are normal. --Small bowel: No dilatation or inflammation. --Colon: No focal abnormality. --Appendix: Normal. VASCULAR/LYMPHATIC: Normal course and caliber of the major abdominal vessels. No abdominal or pelvic lymphadenopathy. REPRODUCTIVE: Enlarged prostate measures 6.3 cm in transverse dimension. MUSCULOSKELETAL. No bony spinal canal stenosis or focal osseous abnormality. OTHER: None. IMPRESSION: 1. Left obstructive uropathy with 6 x 6 mm proximal ureteral stone causing moderate hydroureteronephrosis and perinephric edema. 2. Nonobstructive left renal calculus measuring 6 mm. 3. Enlarged prostate, measuring 6.3 cm. Electronically Signed   By: Ulyses Jarred M.D.   On: 02/26/2018 06:04    (Echo, Carotid, EGD, Colonoscopy, ERCP)    Subjective:   Discharge Exam: Vitals:   02/27/18 2113 02/28/18 0542  BP: 129/80 122/66  Pulse: 64 68  Resp: 16 18  Temp: 98.9 F (37.2 C) 98.3 F (36.8 C)  SpO2: 95% 94%   Vitals:   02/27/18 2030 02/27/18 2045 02/27/18 2113 02/28/18 0542  BP: (!) 143/79 123/70 129/80 122/66  Pulse: 70 64 64 68  Resp: 17 19 16 18   Temp:  98.5 F (36.9 C) 98.9 F (37.2 C) 98.3 F (36.8 C)  TempSrc:   Oral Oral  SpO2: 94% 94% 95% 94%  Weight:      Height:        General: Pt is alert, awake, not in acute distress Cardiovascular: RRR, S1/S2 +, no rubs, no gallops Respiratory: CTA bilaterally, no wheezing, no rhonchi Abdominal: Soft, NT, ND, bowel  sounds + Extremities: no edema, no cyanosis    The results of significant diagnostics from this hospitalization (including imaging, microbiology, ancillary and laboratory) are listed below for reference.     Microbiology: Recent Results (from the past 240 hour(s))  Urine culture     Status: None   Collection Time: 02/26/18  5:04 AM  Result Value Ref Range Status   Specimen Description   Final    URINE, CLEAN CATCH Performed at [facility], [place] [address] [place], [place] [address]    Special Requests   Final    NONE Performed at [facility], [place] [address], [place], [place] [address]    Culture   Final    NO GROWTH Performed at [facility], [place] [address] [place], [place] [address]    Report Status 02/27/2018 FINAL  Final     Labs: BNP (last 3 results) No results for input(s): BNP in the last 8760 hours. Basic Metabolic Panel: Recent Labs  Lab 02/26/18 0512 02/26/18 1949 02/27/18 0510 02/28/18 0845  NA 138 142 140 140  K 4.3 4.7 4.5 4.2  CL 103 106 106 104  CO2  --  24 26  27  GLUCOSE 141* 146* 125* 123*  BUN 14 18 19 18   CREATININE 1.50* 1.66* 1.88* 1.71*  CALCIUM  --  9.1 8.7* 8.8*  MG  --   --  2.1  --   PHOS  --   --  4.0  --    Liver Function Tests: Recent Labs  Lab 02/26/18 1949 02/27/18 0510  AST 30 21  ALT 27 25  ALKPHOS 50 50  BILITOT 1.1 0.7  PROT 6.7 6.2*  ALBUMIN 4.2 3.6   No results for input(s): LIPASE, AMYLASE in the last 168 hours. No results for input(s): AMMONIA in the last 168 hours. CBC: Recent Labs  Lab 02/26/18 0512 02/26/18 1949 02/27/18 0510 02/28/18 0845  WBC  --  12.9* 9.0 8.5  NEUTROABS  --  11.4*  --   --   HGB 15.6 15.0 14.3 13.7  HCT 46.0 43.2 41.2 39.3  MCV  --  84.4 84.8 84.7  PLT  --  234 217 207   Cardiac Enzymes: No results for input(s): CKTOTAL, CKMB, CKMBINDEX, TROPONINI in the last 168 hours. BNP: Invalid input(s): POCBNP CBG: No results for input(s):  GLUCAP in the last 168 hours. D-Dimer No results for input(s): DDIMER in the last 72 hours. Hgb A1c No results for input(s): HGBA1C in the last 72 hours. Lipid Profile No results for input(s): CHOL, HDL, LDLCALC, TRIG, CHOLHDL, LDLDIRECT in the last 72 hours. Thyroid function studies Recent Labs    02/27/18 0510  TSH 1.198   Anemia work up No results for input(s): VITAMINB12, FOLATE, FERRITIN, TIBC, IRON, RETICCTPCT in the last 72 hours. Urinalysis    Component Value Date/Time   COLORURINE YELLOW 02/26/2018 0504   APPEARANCEUR CLEAR 02/26/2018 0504   LABSPEC 1.018 02/26/2018 0504   PHURINE 7.0 02/26/2018 0504   GLUCOSEU NEGATIVE 02/26/2018 0504   HGBUR LARGE (A) 02/26/2018 0504   BILIRUBINUR NEGATIVE 02/26/2018 0504   KETONESUR NEGATIVE 02/26/2018 0504   PROTEINUR NEGATIVE 02/26/2018 0504   UROBILINOGEN 1.0 07/28/2014 1448   NITRITE NEGATIVE 02/26/2018 0504   LEUKOCYTESUR TRACE (A) 02/26/2018 0504   Sepsis Labs Invalid input(s): PROCALCITONIN,  WBC,  LACTICIDVEN Microbiology Recent Results (from the past 240 hour(s))  Urine culture     Status: None   Collection Time: 02/26/18  5:04 AM  Result Value Ref Range Status   Specimen Description   Final    URINE, CLEAN CATCH Performed at [facility], [place] [address], [place], [place] [address]    Special Requests   Final    NONE Performed at [facility], [place] [address], [place], [place] [address]    Culture   Final    NO GROWTH Performed at [facility], [place] [address] [place], [place] [address]    Report Status 02/27/2018 FINAL  Final     Time coordinating discharge: 36 minutes  SIGNED:   Georgette Shell, MD  [facility] 02/28/2018, 1:50 PM Pager   If 7PM-7AM, please contact night-coverage [email or web address] Password [email or web address]

## 2018-02-28 NOTE — Progress Notes (Signed)
Urology Inpatient Progress Report  Ureterolithiasis [N20.1]  Procedure(s): CYSTOSCOPY/URETEROSCOPY/HOLMIUM LASER, STENT PLACEMENT  1 Day Post-Op   Intv/Subj: No acute events overnight. Patient is without complaint. AKI improving  Active Problems:   AKI (acute kidney injury) ([place])   Kidney stone   Leukocytosis   Ureterolithiasis  Current Facility-Administered Medications  Medication Dose Route Frequency Provider Last Rate Last Dose  . acetaminophen (TYLENOL) tablet 650 mg  650 mg Oral Q6H PRN Toy Baker, MD       Or  . acetaminophen (TYLENOL) suppository 650 mg  650 mg Rectal Q6H PRN Doutova, Anastassia, MD      . HYDROcodone-acetaminophen (NORCO/VICODIN) 5-325 MG per tablet 1-2 tablet  1-2 tablet Oral Q4H PRN Toy Baker, MD      . lactated ringers infusion   Intravenous Continuous Marton Redwood III, MD 75 mL/hr at 02/28/18 0540    . morphine 2 MG/ML injection 2 mg  2 mg Intravenous Q4H PRN Toy Baker, MD   2 mg at 02/27/18 0550  . ondansetron (ZOFRAN) tablet 4 mg  4 mg Oral Q6H PRN Doutova, Anastassia, MD       Or  . ondansetron (ZOFRAN) injection 4 mg  4 mg Intravenous Q6H PRN Doutova, Anastassia, MD   4 mg at 02/27/18 0550  . oxyCODONE-acetaminophen (PERCOCET/ROXICET) 5-325 MG per tablet 1 tablet  1 tablet Oral Q4H PRN Toy Baker, MD   1 tablet at 02/28/18 0539  . polyethylene glycol (MIRALAX / GLYCOLAX) packet 17 g  17 g Oral Daily PRN Doutova, Anastassia, MD      . senna (SENOKOT) tablet 8.6 mg  1 tablet Oral BID Toy Baker, MD   8.6 mg at 02/28/18 0941  . tamsulosin (FLOMAX) capsule 0.4 mg  0.4 mg Oral Daily Doutova, Anastassia, MD   0.4 mg at 02/28/18 0941     Objective: Vital: Vitals:   02/27/18 2030 02/27/18 2045 02/27/18 2113 02/28/18 0542  BP: (!) 143/79 123/70 129/80 122/66  Pulse: 70 64 64 68  Resp: 17 19 16 18   Temp:  98.5 F (36.9 C) 98.9 F (37.2 C) 98.3 F (36.8 C)  TempSrc:   Oral Oral  SpO2: 94% 94% 95%  94%  Weight:      Height:       I/Os: I/O last 3 completed shifts: In: 4344.9 [P.O.:1140; I.V.:2604.9; IV Piggyback:600] Out: 1230 [Urine:1225; Blood:5]  Physical Exam:  General: Patient is in no apparent distress Lungs: Normal respiratory effort, chest expands symmetrically. GI:The abdomen is soft and nontender without mass. Ext: lower extremities symmetric  Lab Results: Recent Labs    02/26/18 1949 02/27/18 0510 02/28/18 0845  WBC 12.9* 9.0 8.5  HGB 15.0 14.3 13.7  HCT 43.2 41.2 39.3   Recent Labs    02/26/18 1949 02/27/18 0510 02/28/18 0845  NA 142 140 140  K 4.7 4.5 4.2  CL 106 106 104  CO2 24 26 27   GLUCOSE 146* 125* 123*  BUN 18 19 18   CREATININE 1.66* 1.88* 1.71*  CALCIUM 9.1 8.7* 8.8*   No results for input(s): LABPT, INR in the last 72 hours. No results for input(s): LABURIN in the last 72 hours. Results for orders placed or performed during the hospital encounter of 02/26/18  Urine culture     Status: None   Collection Time: 02/26/18  5:04 AM  Result Value Ref Range Status   Specimen Description   Final    URINE, CLEAN CATCH Performed at [facility], [place]  [place], [place] [address]    Special Requests   Final    NONE Performed at [facility], [place] [address], [place], [place] [address]    Culture   Final    NO GROWTH Performed at [facility], [place] [address], [place], [place] [address]    Report Status 02/27/2018 FINAL  Final    Studies/Results: Dg C-arm 1-60 Min-no Report  Result Date: 02/27/2018 Fluoroscopy was utilized by the requesting physician.  No radiographic interpretation.    Assessment: Left ureteral calculus s/p URS/LL/stent   Procedure(s): CYSTOSCOPY/URETEROSCOPY/HOLMIUM LASER, STENT PLACEMENT, 1 Day Post-Op  doing well.  Plan: Plan to remove stent in 1 week in clinic   Link Snuffer, MD Urology 02/28/2018, 12:44 PM

## 2018-03-06 DIAGNOSIS — R8271 Bacteriuria: Secondary | ICD-10-CM | POA: Diagnosis not present

## 2018-03-06 DIAGNOSIS — N200 Calculus of kidney: Secondary | ICD-10-CM | POA: Diagnosis not present

## 2018-04-23 DIAGNOSIS — N200 Calculus of kidney: Secondary | ICD-10-CM | POA: Diagnosis not present

## 2018-08-09 DIAGNOSIS — E7800 Pure hypercholesterolemia, unspecified: Secondary | ICD-10-CM | POA: Diagnosis not present

## 2018-08-09 DIAGNOSIS — Z0000 Encounter for general adult medical examination without abnormal findings: Secondary | ICD-10-CM | POA: Diagnosis not present

## 2018-09-17 DIAGNOSIS — R972 Elevated prostate specific antigen [PSA]: Secondary | ICD-10-CM | POA: Diagnosis not present

## 2018-10-11 DIAGNOSIS — N400 Enlarged prostate without lower urinary tract symptoms: Secondary | ICD-10-CM | POA: Diagnosis not present

## 2018-10-11 DIAGNOSIS — R972 Elevated prostate specific antigen [PSA]: Secondary | ICD-10-CM | POA: Diagnosis not present

## 2018-10-19 DIAGNOSIS — N400 Enlarged prostate without lower urinary tract symptoms: Secondary | ICD-10-CM | POA: Diagnosis not present

## 2018-11-05 DIAGNOSIS — R972 Elevated prostate specific antigen [PSA]: Secondary | ICD-10-CM | POA: Diagnosis not present

## 2019-04-16 IMAGING — CT CT RENAL STONE PROTOCOL
2 of 4 series · 16 of 46 positions shown, 18 images · non-contrast
Comparison: CT abdomen pelvis 07/28/2014

CLINICAL DATA: Left flank pain

EXAM:
CT ABDOMEN AND PELVIS WITHOUT CONTRAST
TECHNIQUE: Multidetector CT imaging of the abdomen and pelvis was performed
following the standard protocol without IV contrast.

[Series 2: axial st · axial · 0.79mm/px · z∈[+636,+1111]mm · 13 of 106 slices shown, 15 images]
[im 6/106  soft-tissue]
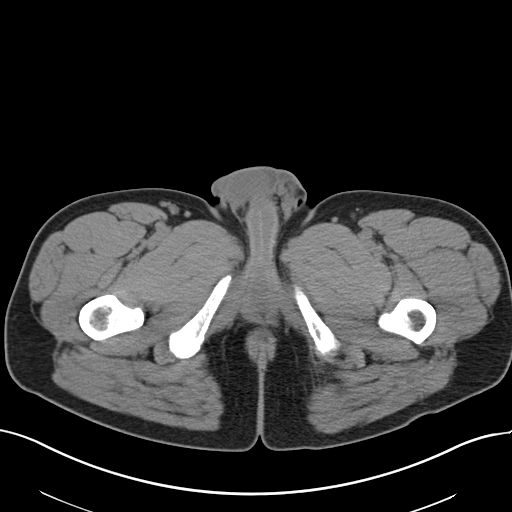
[im 6/106  bone]
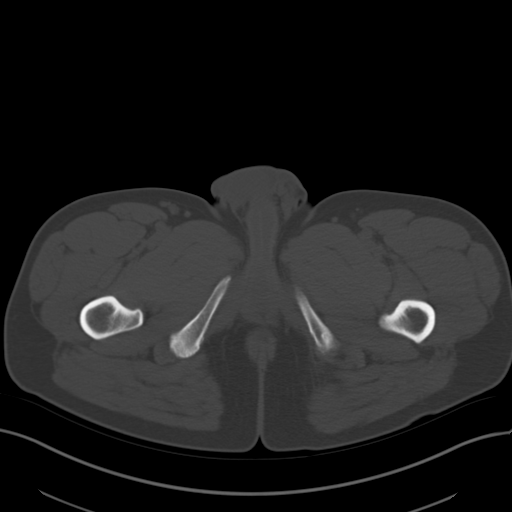
[im 16/106  soft-tissue]
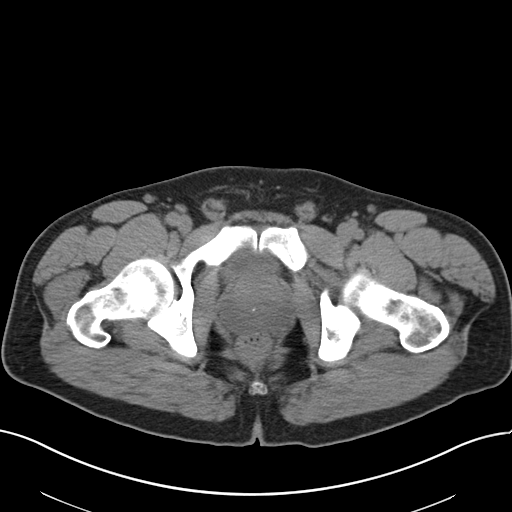
[im 21/106  soft-tissue]
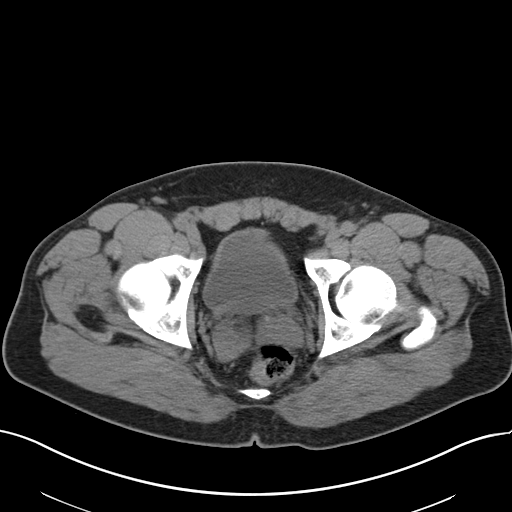
[im 31/106  soft-tissue]
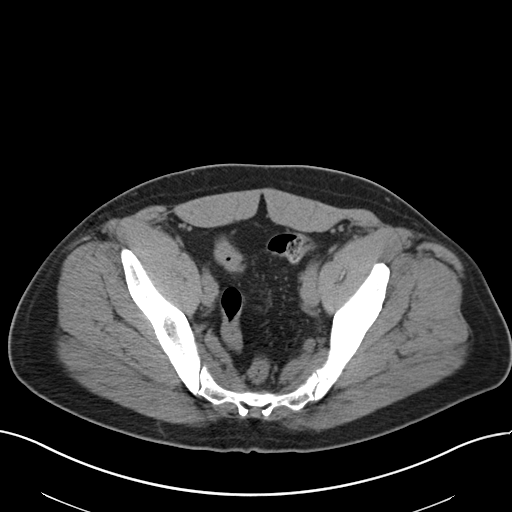
[im 36/106  soft-tissue]
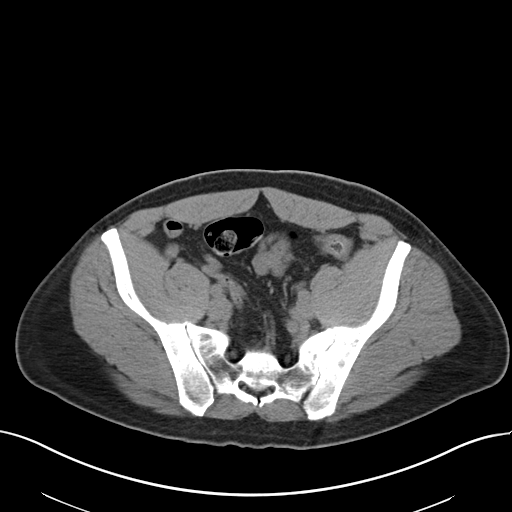
[im 46/106  soft-tissue]
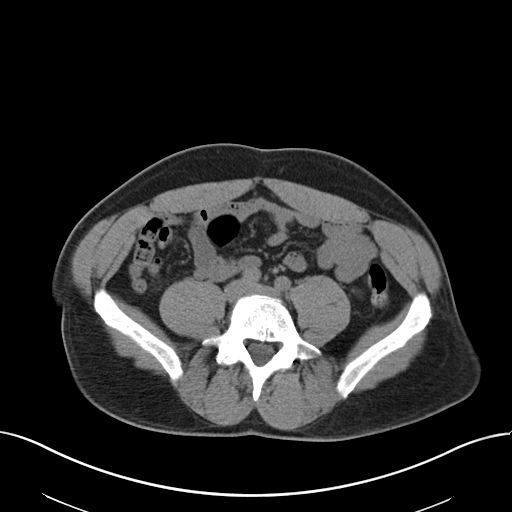
[im 56/106  soft-tissue]
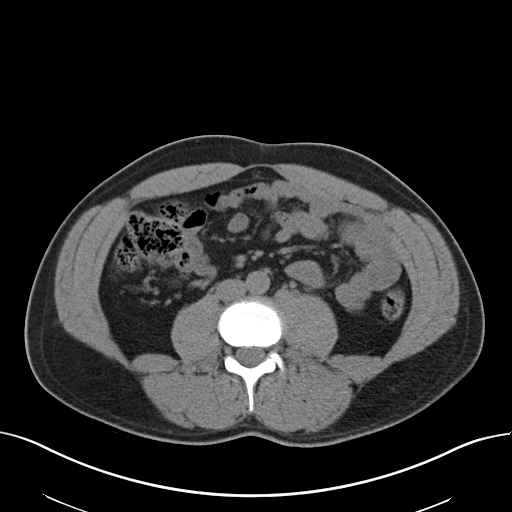
[im 61/106  soft-tissue]
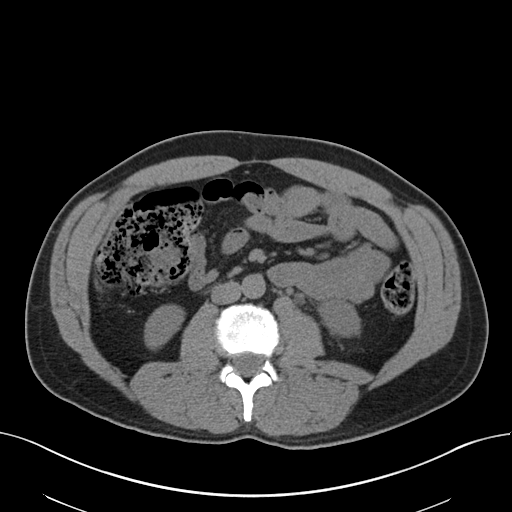
[im 71/106  soft-tissue]
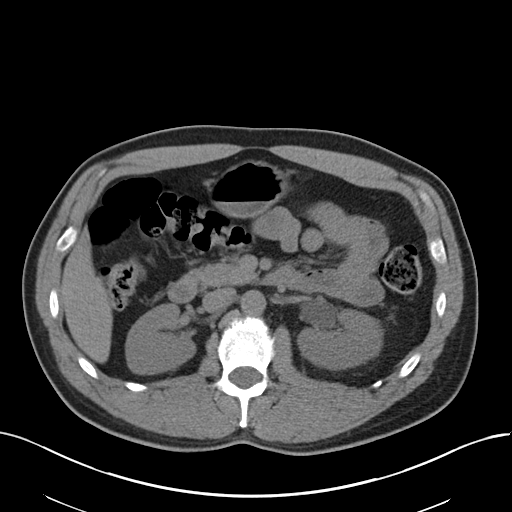
[im 71/106  bone]
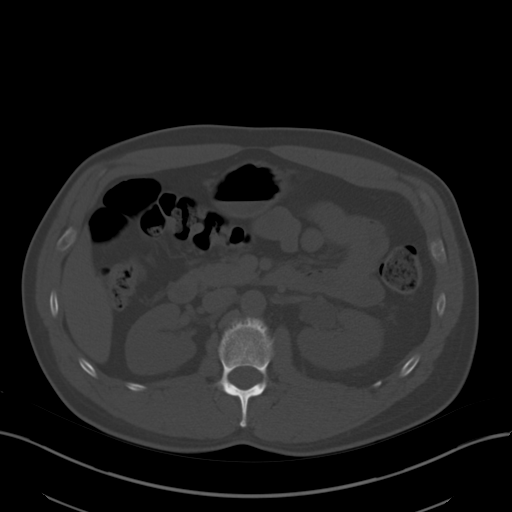
[im 76/106  soft-tissue]
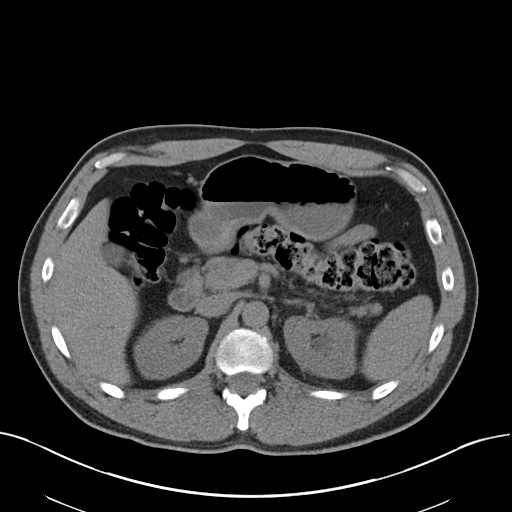
[im 86/106  soft-tissue]
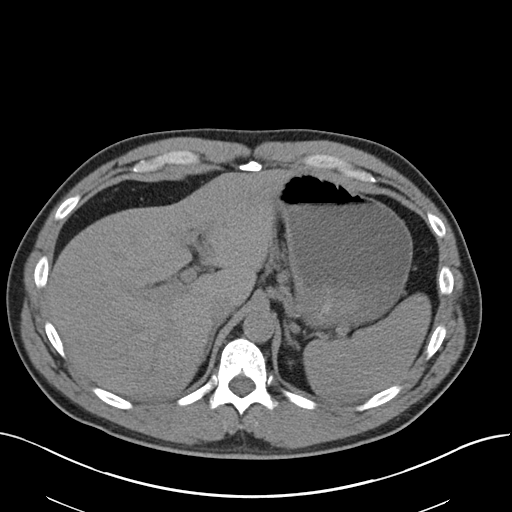
[im 91/106  soft-tissue]
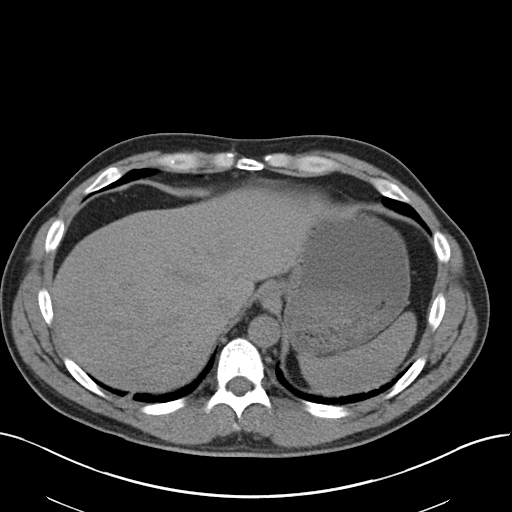
[im 101/106  soft-tissue]
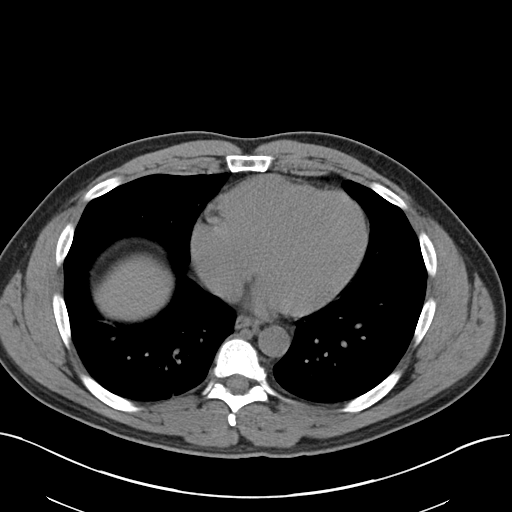

[Series 5: coronal · coronal · 0.80mm/px · 3 of 128 slices shown]
[im 43/128  soft-tissue]
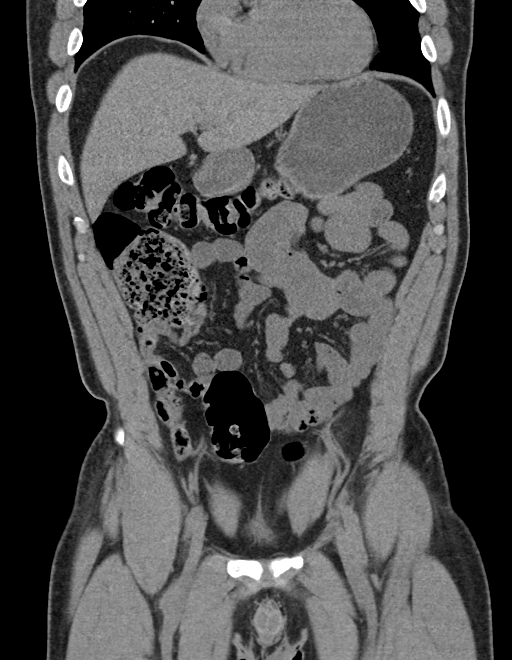
[im 57/128  soft-tissue]
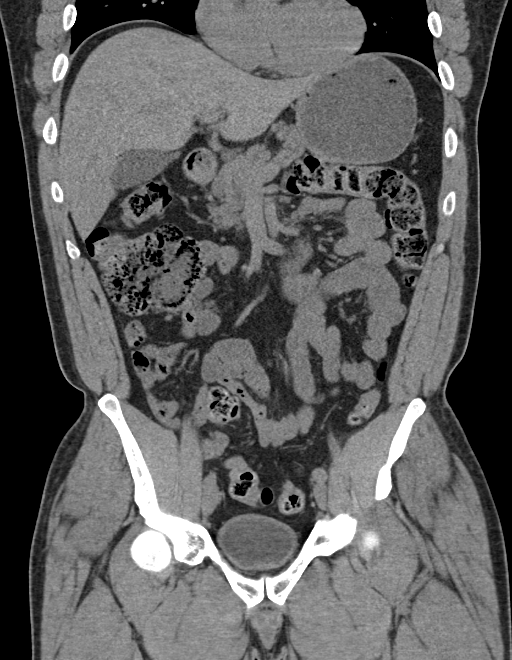
[im 71/128  soft-tissue]
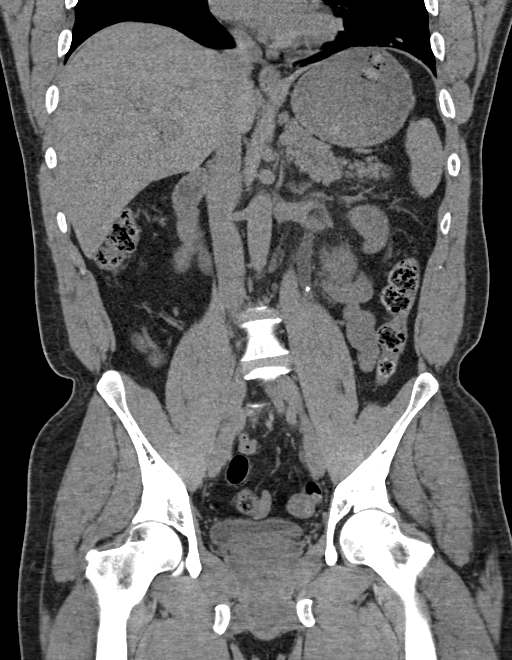

[16 of 46 positions shown; findings below may reference images not displayed]

FINDINGS: LOWER CHEST: There is no basilar pleural or apical pericardial
effusion.

HEPATOBILIARY: The hepatic contours and density are normal. There is
no intra- or extrahepatic biliary dilatation. The gallbladder is
normal.

PANCREAS: The pancreatic parenchymal contours are normal and there
is no ductal dilatation. There is no peripancreatic fluid
collection.

SPLEEN: Normal.

ADRENALS/URINARY TRACT:

--Adrenal glands: Normal.

--Right kidney/ureter: No hydronephrosis, nephroureterolithiasis,
perinephric stranding or solid renal mass.

--Left kidney/ureter: There is a 6 x 6 mm obstructing stone in the
proximal left ureter, causing moderate hydroureteronephrosis and
perinephric edema. There is an additional, nonobstructive renal
calculus that measures 6 mm.

--Urinary bladder: Normal for degree of distention

STOMACH/BOWEL:

--Stomach/Duodenum: There is no hiatal hernia or other gastric
abnormality. The duodenal course and caliber are normal.

--Small bowel: No dilatation or inflammation.

--Colon: No focal abnormality.

--Appendix: Normal.

VASCULAR/LYMPHATIC: Normal course and caliber of the major abdominal
vessels. No abdominal or pelvic lymphadenopathy.

REPRODUCTIVE: Enlarged prostate measures 6.3 cm in transverse
dimension.

MUSCULOSKELETAL. No bony spinal canal stenosis or focal osseous
abnormality.

OTHER: None.
IMPRESSION: 1. Left obstructive uropathy with 6 x 6 mm proximal ureteral stone
causing moderate hydroureteronephrosis and perinephric edema.
2. Nonobstructive left renal calculus measuring 6 mm.
3. Enlarged prostate, measuring 6.3 cm.
